# Patient Record
Sex: Female | Born: 1965 | Race: White | Hispanic: No | Marital: Married | Smoking: Never smoker
Health system: Southern US, Community
[De-identification: ages and names within clinical notes are randomized; demographics above are authoritative.]

## PROBLEM LIST (undated history)

## (undated) DIAGNOSIS — M6740 Ganglion, unspecified site: Secondary | ICD-10-CM

## (undated) DIAGNOSIS — Z973 Presence of spectacles and contact lenses: Secondary | ICD-10-CM

## (undated) DIAGNOSIS — I10 Essential (primary) hypertension: Secondary | ICD-10-CM

## (undated) DIAGNOSIS — M1990 Unspecified osteoarthritis, unspecified site: Secondary | ICD-10-CM

## (undated) DIAGNOSIS — G4733 Obstructive sleep apnea (adult) (pediatric): Secondary | ICD-10-CM

## (undated) DIAGNOSIS — K219 Gastro-esophageal reflux disease without esophagitis: Secondary | ICD-10-CM

## (undated) DIAGNOSIS — N183 Chronic kidney disease, stage 3 unspecified: Secondary | ICD-10-CM

## (undated) DIAGNOSIS — E041 Nontoxic single thyroid nodule: Secondary | ICD-10-CM

## (undated) DIAGNOSIS — R030 Elevated blood-pressure reading, without diagnosis of hypertension: Secondary | ICD-10-CM

## (undated) DIAGNOSIS — E782 Mixed hyperlipidemia: Secondary | ICD-10-CM

## (undated) DIAGNOSIS — R7303 Prediabetes: Secondary | ICD-10-CM

## (undated) DIAGNOSIS — N8500 Endometrial hyperplasia, unspecified: Secondary | ICD-10-CM

## (undated) DIAGNOSIS — N393 Stress incontinence (female) (male): Secondary | ICD-10-CM

## (undated) DIAGNOSIS — H04123 Dry eye syndrome of bilateral lacrimal glands: Secondary | ICD-10-CM

## (undated) HISTORY — PX: SHOULDER SURGERY: SHX246

## (undated) HISTORY — DX: Nontoxic single thyroid nodule: E04.1

## (undated) HISTORY — DX: Elevated blood-pressure reading, without diagnosis of hypertension: R03.0

## (undated) HISTORY — PX: THYROIDECTOMY: SHX17

## (undated) HISTORY — DX: Ganglion, unspecified site: M67.40

## (undated) HISTORY — DX: Obstructive sleep apnea (adult) (pediatric): G47.33

---
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
## 1998-04-01 ENCOUNTER — Other Ambulatory Visit: Admission: RE | Admit: 1998-04-01 | Discharge: 1998-04-01 | Payer: Self-pay | Admitting: Obstetrics and Gynecology

## 1998-04-27 ENCOUNTER — Other Ambulatory Visit: Admission: RE | Admit: 1998-04-27 | Discharge: 1998-04-27 | Payer: Self-pay | Admitting: *Deleted

## 1998-09-24 ENCOUNTER — Inpatient Hospital Stay (HOSPITAL_COMMUNITY): Admission: AD | Admit: 1998-09-24 | Discharge: 1998-09-27 | Payer: Self-pay | Admitting: Obstetrics and Gynecology

## 1998-10-23 ENCOUNTER — Other Ambulatory Visit: Admission: RE | Admit: 1998-10-23 | Discharge: 1998-10-23 | Payer: Self-pay | Admitting: Obstetrics and Gynecology

## 1999-10-28 ENCOUNTER — Other Ambulatory Visit: Admission: RE | Admit: 1999-10-28 | Discharge: 1999-10-28 | Payer: Self-pay | Admitting: Obstetrics and Gynecology

## 1999-11-16 ENCOUNTER — Encounter: Admission: RE | Admit: 1999-11-16 | Discharge: 2000-02-14 | Payer: Self-pay | Admitting: Obstetrics & Gynecology

## 2000-11-22 ENCOUNTER — Other Ambulatory Visit: Admission: RE | Admit: 2000-11-22 | Discharge: 2000-11-22 | Payer: Self-pay | Admitting: Obstetrics and Gynecology

## 2001-02-01 ENCOUNTER — Encounter (INDEPENDENT_AMBULATORY_CARE_PROVIDER_SITE_OTHER): Payer: Self-pay | Admitting: *Deleted

## 2001-02-01 ENCOUNTER — Ambulatory Visit (HOSPITAL_COMMUNITY): Admission: RE | Admit: 2001-02-01 | Discharge: 2001-02-01 | Payer: Self-pay | Admitting: *Deleted

## 2001-11-26 ENCOUNTER — Other Ambulatory Visit: Admission: RE | Admit: 2001-11-26 | Discharge: 2001-11-26 | Payer: Self-pay | Admitting: Obstetrics and Gynecology

## 2001-11-27 ENCOUNTER — Ambulatory Visit (HOSPITAL_COMMUNITY): Admission: RE | Admit: 2001-11-27 | Discharge: 2001-11-27 | Payer: Self-pay

## 2002-02-18 ENCOUNTER — Ambulatory Visit (HOSPITAL_COMMUNITY): Admission: RE | Admit: 2002-02-18 | Discharge: 2002-02-18 | Payer: Self-pay

## 2002-04-01 ENCOUNTER — Encounter: Admission: RE | Admit: 2002-04-01 | Discharge: 2002-04-01 | Payer: Self-pay

## 2002-05-01 ENCOUNTER — Ambulatory Visit (HOSPITAL_COMMUNITY): Admission: RE | Admit: 2002-05-01 | Discharge: 2002-05-01 | Payer: Self-pay

## 2002-06-14 ENCOUNTER — Ambulatory Visit (HOSPITAL_COMMUNITY): Admission: RE | Admit: 2002-06-14 | Discharge: 2002-06-14 | Payer: Self-pay

## 2002-06-18 ENCOUNTER — Inpatient Hospital Stay (HOSPITAL_COMMUNITY): Admission: AD | Admit: 2002-06-18 | Discharge: 2002-06-18 | Payer: Self-pay

## 2002-07-04 ENCOUNTER — Ambulatory Visit (HOSPITAL_COMMUNITY): Admission: RE | Admit: 2002-07-04 | Discharge: 2002-07-04 | Payer: Self-pay

## 2002-07-09 ENCOUNTER — Inpatient Hospital Stay (HOSPITAL_COMMUNITY): Admission: AD | Admit: 2002-07-09 | Discharge: 2002-07-12 | Payer: Self-pay

## 2002-07-09 ENCOUNTER — Encounter (INDEPENDENT_AMBULATORY_CARE_PROVIDER_SITE_OTHER): Payer: Self-pay

## 2002-12-09 ENCOUNTER — Other Ambulatory Visit: Admission: RE | Admit: 2002-12-09 | Discharge: 2002-12-09 | Payer: Self-pay

## 2002-12-20 ENCOUNTER — Ambulatory Visit (HOSPITAL_COMMUNITY): Admission: RE | Admit: 2002-12-20 | Discharge: 2002-12-20 | Payer: Self-pay | Admitting: Endocrinology

## 2002-12-20 ENCOUNTER — Encounter: Payer: Self-pay | Admitting: Endocrinology

## 2002-12-30 ENCOUNTER — Encounter (INDEPENDENT_AMBULATORY_CARE_PROVIDER_SITE_OTHER): Payer: Self-pay | Admitting: *Deleted

## 2002-12-30 ENCOUNTER — Ambulatory Visit (HOSPITAL_COMMUNITY): Admission: RE | Admit: 2002-12-30 | Discharge: 2002-12-30 | Payer: Self-pay | Admitting: Endocrinology

## 2002-12-30 ENCOUNTER — Encounter: Payer: Self-pay | Admitting: Endocrinology

## 2003-12-24 ENCOUNTER — Other Ambulatory Visit: Admission: RE | Admit: 2003-12-24 | Discharge: 2003-12-24 | Payer: Self-pay | Admitting: Obstetrics and Gynecology

## 2004-10-08 ENCOUNTER — Ambulatory Visit (HOSPITAL_COMMUNITY): Admission: RE | Admit: 2004-10-08 | Discharge: 2004-10-08 | Payer: Self-pay | Admitting: Endocrinology

## 2004-12-29 ENCOUNTER — Other Ambulatory Visit: Admission: RE | Admit: 2004-12-29 | Discharge: 2004-12-29 | Payer: Self-pay | Admitting: Obstetrics and Gynecology

## 2005-01-19 ENCOUNTER — Ambulatory Visit (HOSPITAL_COMMUNITY): Admission: RE | Admit: 2005-01-19 | Discharge: 2005-01-19 | Payer: Self-pay | Admitting: Endocrinology

## 2005-02-03 ENCOUNTER — Encounter (HOSPITAL_COMMUNITY): Admission: RE | Admit: 2005-02-03 | Discharge: 2005-02-03 | Payer: Self-pay | Admitting: Endocrinology

## 2005-06-10 DIAGNOSIS — E890 Postprocedural hypothyroidism: Secondary | ICD-10-CM

## 2005-06-10 HISTORY — DX: Postprocedural hypothyroidism: E89.0

## 2005-06-21 ENCOUNTER — Encounter (INDEPENDENT_AMBULATORY_CARE_PROVIDER_SITE_OTHER): Payer: Self-pay | Admitting: *Deleted

## 2005-06-21 ENCOUNTER — Ambulatory Visit (HOSPITAL_COMMUNITY): Admission: RE | Admit: 2005-06-21 | Discharge: 2005-06-22 | Payer: Self-pay | Admitting: Surgery

## 2005-06-21 DIAGNOSIS — Z8639 Personal history of other endocrine, nutritional and metabolic disease: Secondary | ICD-10-CM

## 2005-06-21 HISTORY — PX: TOTAL THYROIDECTOMY: SHX2547

## 2005-06-21 HISTORY — DX: Personal history of other endocrine, nutritional and metabolic disease: Z86.39

## 2006-10-18 IMAGING — CR DG CHEST 2V
2 series · 2 of 2 positions shown · non-contrast
Comparison: none

CLINICAL DATA: Goiter. 
 CHEST - 2 VIEW:

[view not recorded (1 of 2)]
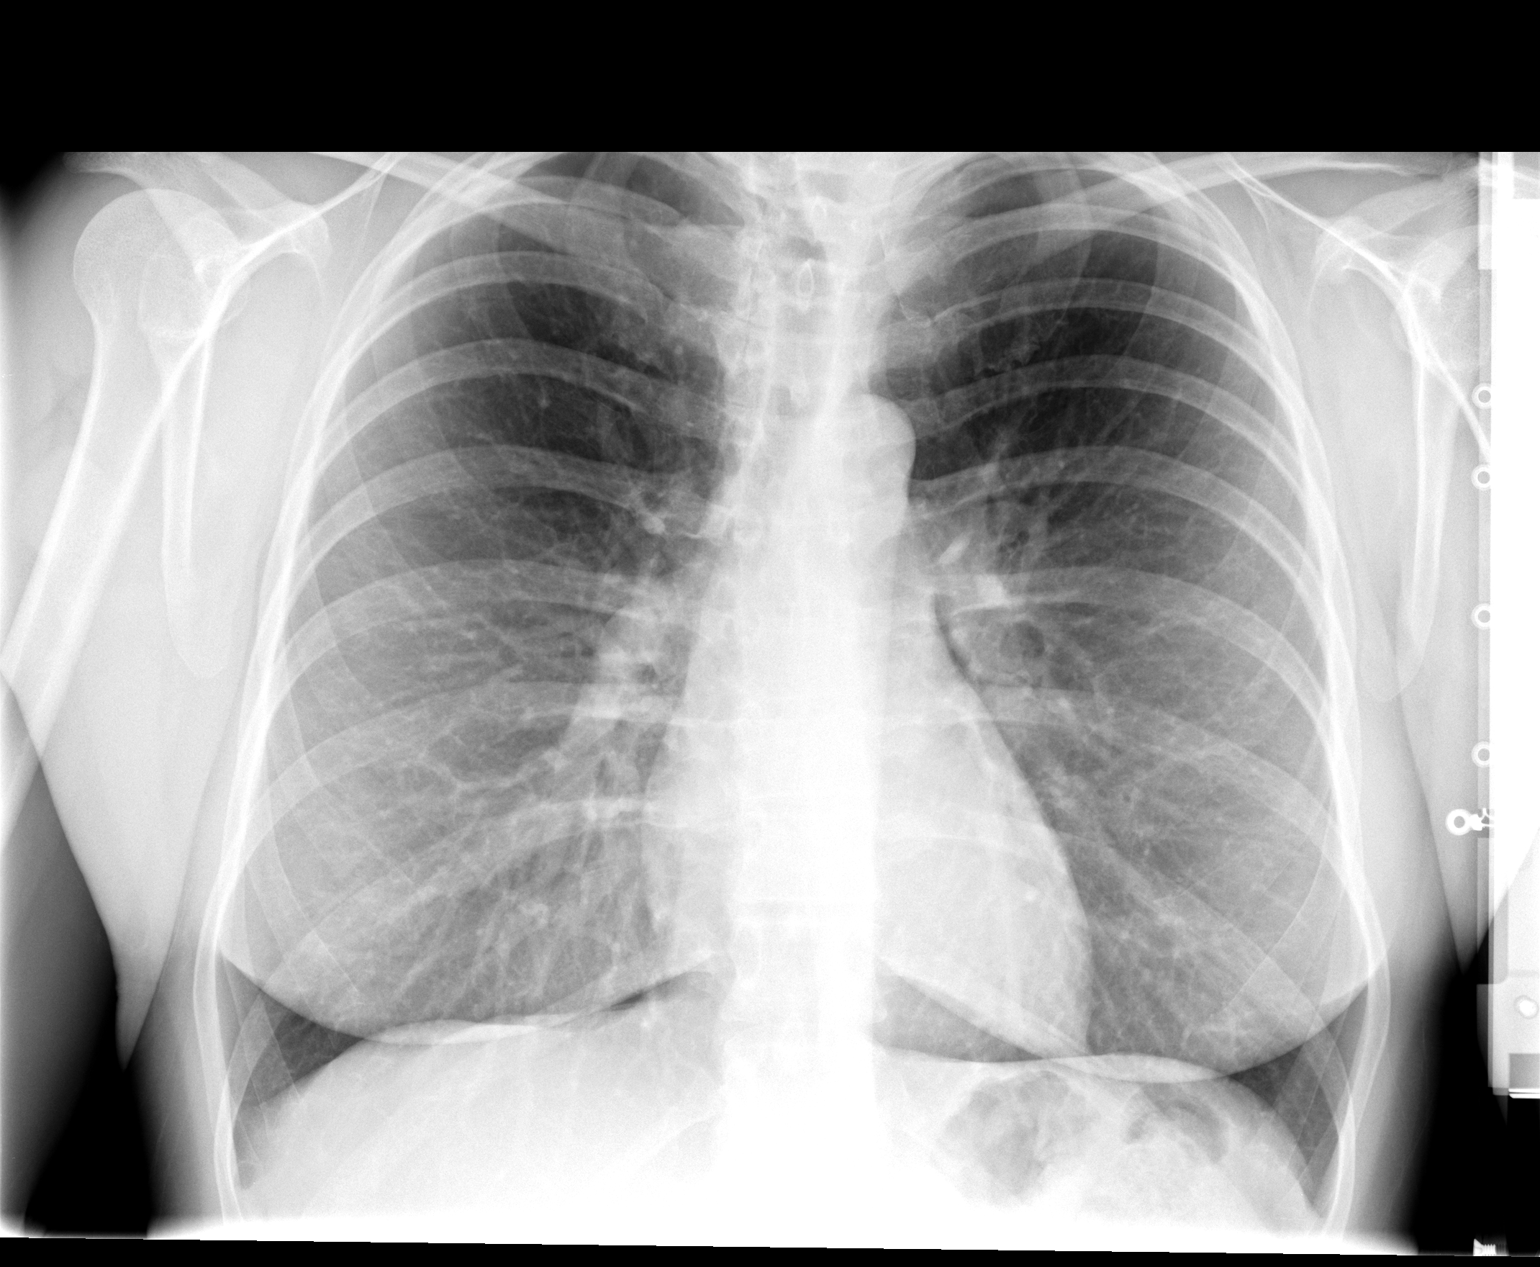

[view not recorded (2 of 2)]
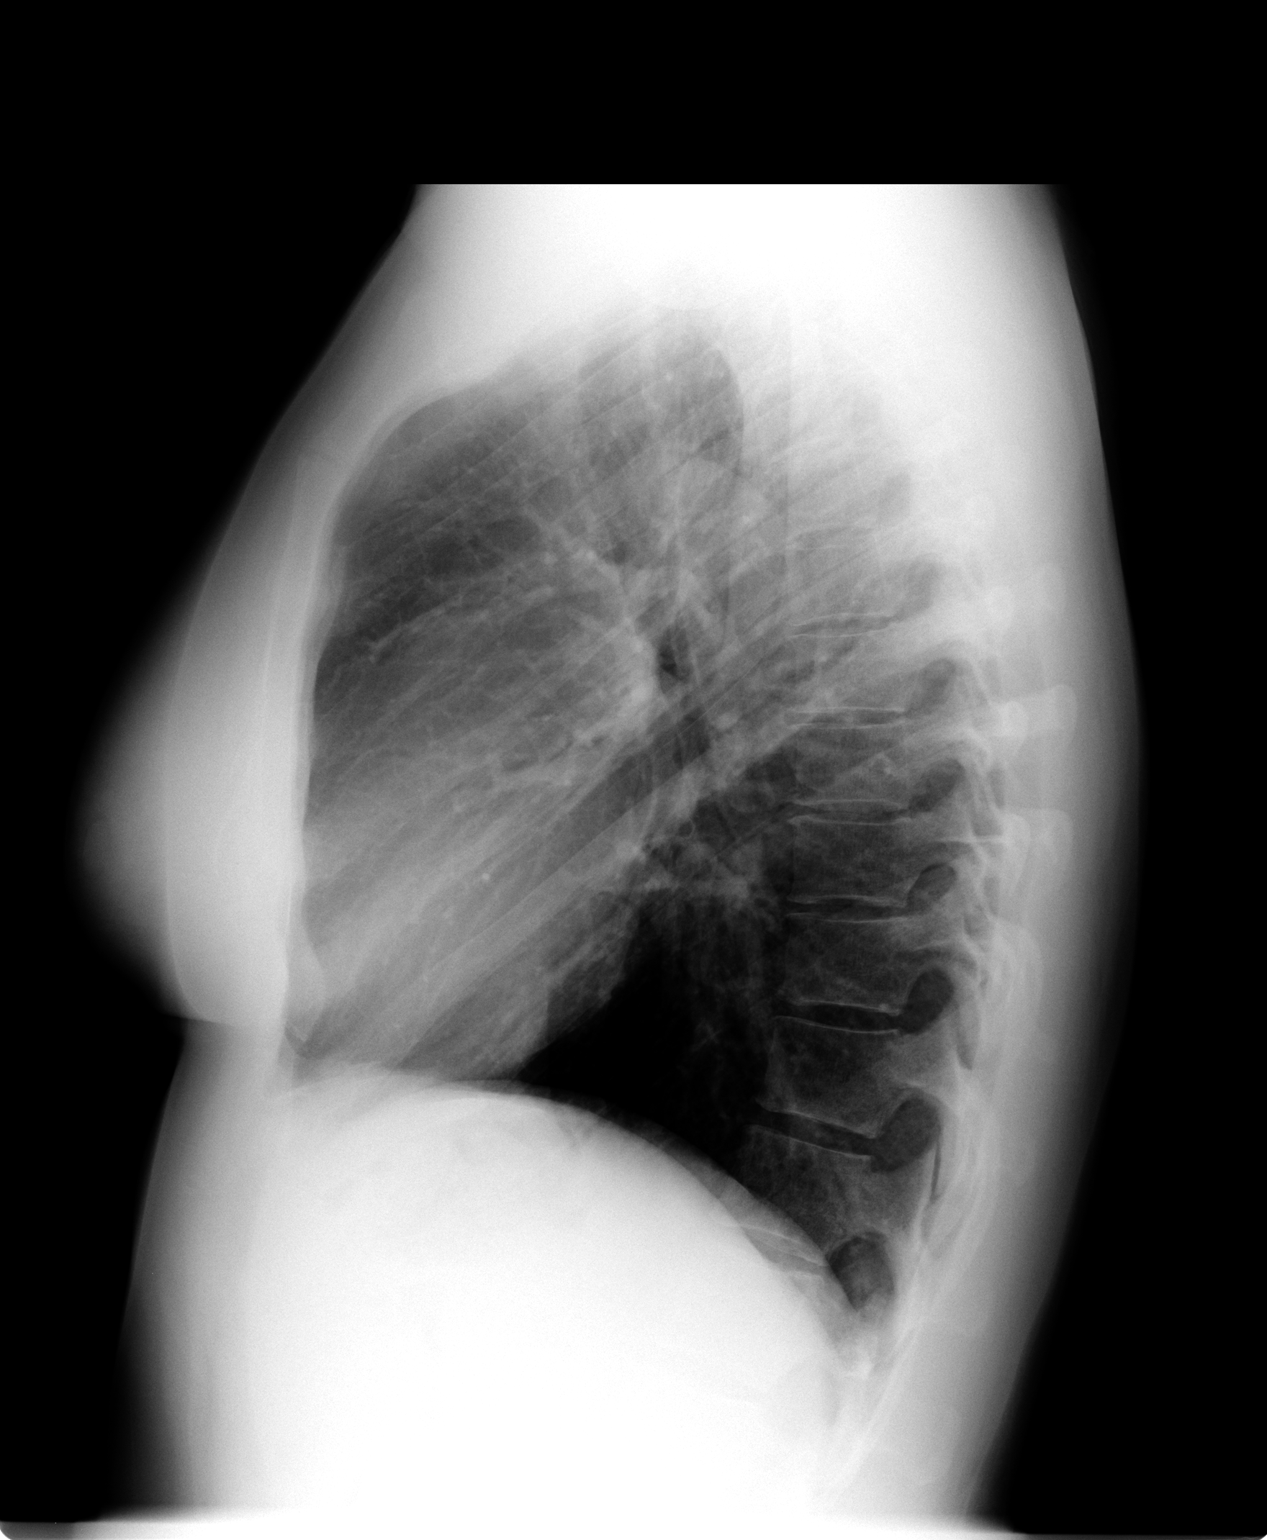

[2 of 2 positions shown; findings below may reference images not displayed]

FINDINGS: There is rightward deviation of the trachea, consistent with given history of thyroid enlargement.  Lungs are clear.  Heart size normal.
IMPRESSION: 1.  No acute cardiopulmonary process. 
 2.  Rightward deviation of the trachea, consistent with given history of goiter.

## 2007-06-08 ENCOUNTER — Other Ambulatory Visit: Admission: RE | Admit: 2007-06-08 | Discharge: 2007-06-08 | Payer: Self-pay | Admitting: Obstetrics and Gynecology

## 2007-09-27 ENCOUNTER — Encounter: Admission: RE | Admit: 2007-09-27 | Discharge: 2007-09-27 | Payer: Self-pay | Admitting: Obstetrics and Gynecology

## 2007-10-05 ENCOUNTER — Encounter: Admission: RE | Admit: 2007-10-05 | Discharge: 2007-10-05 | Payer: Self-pay | Admitting: Obstetrics and Gynecology

## 2008-05-05 ENCOUNTER — Encounter: Admission: RE | Admit: 2008-05-05 | Discharge: 2008-05-05 | Payer: Self-pay | Admitting: Obstetrics and Gynecology

## 2008-06-09 ENCOUNTER — Other Ambulatory Visit: Admission: RE | Admit: 2008-06-09 | Discharge: 2008-06-09 | Payer: Self-pay | Admitting: Obstetrics and Gynecology

## 2008-10-29 ENCOUNTER — Encounter: Admission: RE | Admit: 2008-10-29 | Discharge: 2008-10-29 | Payer: Self-pay | Admitting: Obstetrics and Gynecology

## 2009-05-18 ENCOUNTER — Encounter: Admission: RE | Admit: 2009-05-18 | Discharge: 2009-05-18 | Payer: Self-pay | Admitting: Obstetrics and Gynecology

## 2009-06-10 ENCOUNTER — Other Ambulatory Visit: Admission: RE | Admit: 2009-06-10 | Discharge: 2009-06-10 | Payer: Self-pay | Admitting: Obstetrics and Gynecology

## 2009-11-03 ENCOUNTER — Encounter: Admission: RE | Admit: 2009-11-03 | Discharge: 2009-11-03 | Payer: Self-pay | Admitting: Obstetrics and Gynecology

## 2010-06-17 ENCOUNTER — Other Ambulatory Visit: Admission: RE | Admit: 2010-06-17 | Discharge: 2010-06-17 | Payer: Self-pay | Admitting: Obstetrics and Gynecology

## 2010-10-31 ENCOUNTER — Encounter: Payer: Self-pay | Admitting: Endocrinology

## 2010-10-31 ENCOUNTER — Encounter: Payer: Self-pay | Admitting: Obstetrics and Gynecology

## 2011-01-12 ENCOUNTER — Other Ambulatory Visit: Payer: Self-pay | Admitting: Dermatology

## 2011-02-25 NOTE — Discharge Summary (Signed)
NAME:  Sydney Chapman, Sydney Chapman                           ACCOUNT NO.:  [id number]   MEDICAL RECORD NO.:  [id number]                   PATIENT TYPE:  INP   LOCATION:  9115                                 FACILITY:  [facility]   PHYSICIAN:  Ronda Fairly. Galen Daft, M.D.              DATE OF BIRTH:  02-07-1966   DATE OF ADMISSION:  07/09/2002  DATE OF DISCHARGE:  07/12/2002                                 DISCHARGE SUMMARY   ADMISSION DIAGNOSES:  Fetal macrosomia.   PRINCIPAL DIAGNOSES:  Fetal macrosomia.   SECONDARY DIAGNOSES:  1. Multiparity.  2. Advanced maternal age.  3. Desires elective sterilization.   PRINCIPAL PROCEDURE:  Primary low transverse cesarean section.   SECONDARY PROCEDURE:  Bilateral tubal ligation.   COMPLICATIONS:  None.   CONDITION ON DISCHARGE:  Stable.   HOSPITAL COURSE:  The patient was a G3, P2 with two prior spontaneous  vaginal deliveries who was admitted at 39 weeks because of term pregnancy  with an estimated fetal weight of 4618 g from an ultrasound a few days prior  to admission.  The patient was apprised of the risks of vaginal delivery  with the estimated fetal weight greater than 10 pounds and the option of  cesarean section.  She also wished to have a tubal ligation.  This was  therefore implemented on September 30.  She had an elective cesarean section  for fetal macrosomia and a bilateral tubal ligation.  She had a baby girl  weight of 8 pounds 11 ounces, Apgars of 9 at one minute and 9 at five  minutes.  On postoperative day one there were no complaints.  She was doing  quite well and the abdomen had some oozing on the day before this, but was  dry on the first postoperative day.  On the second postoperative day she was  doing very well and considering going home at that point.  However, she  wanted to stay a third day as she wanted some additional rest in the  hospital after cesarean section.  She had a bowel movement prior to  discharge, full activity, and  regular diet without any difficulty.  Her  postoperative hemoglobin was stable and the patient had a normal pathology  of the tubes bilaterally without any evidence of disease and there was  complete transection.  Her postoperative hemoglobin was 11.2.  The RPR was  nonreactive.  She was discharged home with Percocet and to follow up in the  office in two weeks.  Activity limits, wound care, and medications were  reviewed with the patient as well as diet.                                               Ronda Fairly. Galen Daft, M.D.  NJT/MEDQ  D:  08/12/2002  T:  08/12/2002  Job:  [id number]

## 2011-02-25 NOTE — Op Note (Signed)
NAME:  Sydney Chapman, Sydney Chapman                 ACCOUNT NO.:  [id number]   MEDICAL RECORD NO.:  [id number]          PATIENT TYPE:  AMB   LOCATION:  DAY                          FACILITY:  [facility]   PHYSICIAN:  Velora Heckler, MD      DATE OF BIRTH:  May 14, 1966   DATE OF PROCEDURE:  06/21/2005  DATE OF DISCHARGE:                                 OPERATIVE REPORT   PREOPERATIVE DIAGNOSIS:  Thyroid nodules, compressive symptoms.   POSTOPERATIVE DIAGNOSIS:  Thyroid nodules, compressive symptoms.   PROCEDURE:  Total thyroidectomy.   SURGEON:  Velora Heckler, M.D.>   ASSISTANT:  Adolph Pollack, M.D.   ANESTHESIA:  General.   ESTIMATED BLOOD LOSS:  Minimal.   PREPARATION:  Betadine.   COMPLICATIONS:  None.   INDICATIONS:  The patient is 45 year old white female from [place], [place]. The patient was referred by Dr. Dorisann Frames for  multinodular  goiter with dominant left-sided mass. This had been diagnosed in 1992. Gland  had gradually increased in size. Fine-needle aspiration cytology was benign  on two occasions. The patient however developed compressive symptoms.  Nuclear medicine scanning showed the dominant left-sided nodule to be cold  in nature. The patient was referred for thyroidectomy.   PROCEDURE IN DETAIL:  The procedure was done in OR #11 at the [facility]. The patient is brought to the operating room, placed in  supine position on the operating room table. Following administration of  general anesthesia the patient is prepped and draped in usual strict aseptic  fashion. After ascertaining that an adequate level of anesthesia been  obtained, a Kocher incision was made with a #10 blade. Dissection was  carried through subcutaneous tissues and platysma. Hemostasis was obtained  with electrocautery. Skin flaps were developed cephalad and caudad from the  thyroid notch to the sternal notch. A Mahorner self-retaining retractor was  placed for  exposure. Strap muscles were incised in the midline. Dissection  is begun on the left side of the neck. Strap muscles were reflected  laterally. Left thyroid lobe was dissected out. Venous tributaries coming in  inferiorly were ligated in continuity with 2-0 silk ties and divided. Gland  is rolled anteriorly. Superior pole was dissected out. Superior pole vessels  are ligated individually with medium Ligaclips and divided. Gland is rolled  further anteriorly. Parathyroid tissue is identified and preserved.  Recurrent laryngeal nerve is identified and preserved. Branches of the  inferior thyroid artery are divided between small Ligaclips.  Ligament of  Allyson Sabal was divided with electrocautery carefully. Gland is rolled anteriorly  up and onto the anterior trachea. Dry pack is placed in the left neck.   Next, we turned our attention to the right thyroid lobe. This was more  normal size. Left thyroid lobe measured approximately 8 cm at greatest  dimension. It was essentially entirely replaced by the dominant nodule.  Right thyroid lobe, however, appears grossly normal with some several  nodularity particularly in the superior pole. Strap muscles are again  reflected laterally.  Middle thyroid vein is divided between small  Ligaclips. Inferior venous tributaries are divided between small and medium  Ligaclips.  Superior pole vessels are dissected out, ligated in continuity  with 2-0 silk ties and medium Ligaclips and divided. Gland is rolled  anteriorly. Parathyroid tissue is identified and preserved. Recurrent nerve  is identified and preserved. Branches of the inferior thyroid artery are  divided between small Ligaclips. Ligament of Allyson Sabal is transected with  electrocautery and the gland is rolled up and onto the anterior trachea from  which it was excised completely with electrocautery. Sutures used to mark  the left superior pole. Entire thyroid gland is submitted to pathology for  review.  Neck is irrigated on both sides. Good hemostasis was noted. Surgicel  is placed over the area of the recurrent nerves bilaterally. Strap muscles  were reapproximated midline with interrupted 3-0 Vicryl sutures. Platysma  was closed with interrupted 3-0 Vicryl sutures. Skin is closed with running  4-0 Vicryl subcuticular suture. Wound is washed and dried. Benzoin, Steri-  Strips are applied. Sterile dressings were applied. The patient is awakened  from anesthesia and brought to the recovery room in stable condition. The  patient tolerated the procedure well.      Velora Heckler, MD  Electronically Signed     TMG/MEDQ  D:  06/21/2005  T:  06/21/2005  Job:  [id number]   cc:   Dorisann Frames, M.D.  [place], [place] [address]  Fax: [phone]   Dr. Suzzette Righter   Artist Pais, M.D.  [address], [address]  [place]  [place] [address]  Fax: [phone]

## 2011-02-25 NOTE — Op Note (Signed)
NAME:  Sydney Chapman, Sydney Chapman                           ACCOUNT NO.:  [id number]   MEDICAL RECORD NO.:  [id number]                   PATIENT TYPE:  INP   LOCATION:  9199                                 [facility]ILITY:  [facility]   PHYSICIAN:  Ronda Fairly. Galen Daft, M.D.              DATE OF BIRTH:  1966/08/31   DATE OF PROCEDURE:  07/09/2002  DATE OF DISCHARGE:                                 OPERATIVE REPORT   PREOPERATIVE DIAGNOSES:  1. Fetal macrosomia at term.  2. Multiparity.  3. Advanced maternal age.  4. Desires elective sterilization.   POSTOPERATIVE DIAGNOSES:  1. Fetal macrosomia at term.  2. Multiparity.  3. Advanced maternal age.  4. Desires elective sterilization.   PROCEDURE:  Elective primary cesarean section with bilateral tubal ligation.   SURGEON:  Ronda Fairly. Galen Daft, M.D.   ANESTHESIA:  Spinal.   COMPLICATIONS:  None.   ESTIMATED BLOOD LOSS:  800 cc.   FINDINGS:  Female infant.  Apgars 9 at one minute and 9 at five minutes.  Weight 8 pounds 11 ounces.   PROCEDURE NOTE:  The patient was identified as herself.  She had informed  consent for tubal ligation and cesarean section.  The estimated fetal weight  by ultrasound examination was greater then 4600 grams, greater than 10  pounds.  The patient had been apprised of this risk of vaginal birth versus  cesarean and she elected for cesarean delivery.  At the same time, she also  wished to have tubal ligation.  The informed consent process was  carried  out prior to bringing her into the operating room and the patient had  preoperative antibiotics, 2 grams ampicillin.  A Pfannenstiel incision was  utilized after Betadine prep, sterile technique, and Foley catheterization.  The Pfannenstiel incision entered the abdomen clear or containing  structures.  The fascia was transected transversely and the abdomen was  entered, clear of all vital structures.  The bladder flap was created over  the visceral peritoneum overlying the uterus.   A low transverse uterine  incision was carried out.  The baby was in vertex presentation, baby girl.  Apgars 9 at one minute and 9 at five minutes, a weight of 8 pounds 11  ounces.  The placenta delivered spontaneously through the incision and the  uterus was swept free of containing substances.  The uterus was exteriorized  and the incision was closed with a running locking suture of 0 Vicryl.  There was complete hemostasis noted at the site.  The left tube was  identified down to its fimbriated end.  It was grasped with Tanja Port in the  mid section and tied x2 using 0 plain catgut.  This mid segment was removed  and there was complete hemostasis.  The left tube was then again checked for  hemostasis and was complete.  The right tube was identified in a similar  fashion down the fimbriated  end, separate and distinct from all other vital  structures.  It was ligated in the mid segment after being grasped with a  Babcock with two sutures of 0 plain catgut.  Again, hemostasis was noted.  The ovaries were unremarkable.  The uterus itself was unremarkable.  The  uterus was placed back into the abdominal cavity after suction irrigation  was carried out and it was again inspected for hemostasis, which was  complete.  Both tubal segments looked intact as well.  The muscle was  reapproximated in the midline with interrupted suture of 1 Vicryl.  This was  followed by closure of the fascia with 1 Vicryl from either angle.  There  was complete hemostasis noted.  All sponge, instrument, and needle counts  were correct.  The patient had no evidence of bleeding and the skin was then  closed with 3-0 Monocryl subcuticular closure.  Again, the last sponge,  instrument, and needle count was correct.  The patient left the operating  room in stable condition.  Total estimated blood loss was 800 cc.  There  were no complications.                                                Ronda Fairly. Galen Daft, M.D.     NJT/MEDQ  D:  07/09/2002  T:  07/09/2002  Job:  [id number]

## 2011-06-22 ENCOUNTER — Other Ambulatory Visit (HOSPITAL_COMMUNITY)
Admission: RE | Admit: 2011-06-22 | Discharge: 2011-06-22 | Disposition: A | Discharge status: Home / Self Care | Payer: No Typology Code available for payment source | Source: Ambulatory Visit | Attending: Obstetrics and Gynecology | Admitting: Obstetrics and Gynecology

## 2011-06-22 ENCOUNTER — Other Ambulatory Visit: Payer: Self-pay | Admitting: Obstetrics and Gynecology

## 2011-06-22 DIAGNOSIS — Z1159 Encounter for screening for other viral diseases: Secondary | ICD-10-CM | POA: Insufficient documentation

## 2011-06-22 DIAGNOSIS — Z01419 Encounter for gynecological examination (general) (routine) without abnormal findings: Secondary | ICD-10-CM | POA: Insufficient documentation

## 2012-06-25 ENCOUNTER — Other Ambulatory Visit: Payer: Self-pay | Admitting: Obstetrics and Gynecology

## 2012-06-25 DIAGNOSIS — Z1231 Encounter for screening mammogram for malignant neoplasm of breast: Secondary | ICD-10-CM

## 2012-07-09 ENCOUNTER — Ambulatory Visit
Admission: RE | Admit: 2012-07-09 | Discharge: 2012-07-09 | Disposition: A | Discharge status: Home / Self Care | Payer: BC Managed Care – PPO | Source: Ambulatory Visit | Attending: Obstetrics and Gynecology | Admitting: Obstetrics and Gynecology

## 2012-07-09 DIAGNOSIS — Z1231 Encounter for screening mammogram for malignant neoplasm of breast: Secondary | ICD-10-CM

## 2012-07-25 ENCOUNTER — Ambulatory Visit: Payer: No Typology Code available for payment source

## 2012-12-13 ENCOUNTER — Other Ambulatory Visit: Payer: Self-pay | Admitting: Dermatology

## 2013-09-25 ENCOUNTER — Other Ambulatory Visit: Payer: Self-pay | Admitting: Sports Medicine

## 2013-09-25 DIAGNOSIS — M25511 Pain in right shoulder: Secondary | ICD-10-CM

## 2013-10-10 HISTORY — PX: SHOULDER ARTHROSCOPY W/ LABRAL REPAIR: SHX2399

## 2014-05-28 ENCOUNTER — Other Ambulatory Visit: Payer: Self-pay

## 2014-05-28 DIAGNOSIS — Z1231 Encounter for screening mammogram for malignant neoplasm of breast: Secondary | ICD-10-CM

## 2014-06-05 ENCOUNTER — Ambulatory Visit
Admission: RE | Admit: 2014-06-05 | Discharge: 2014-06-05 | Disposition: A | Discharge status: Home / Self Care | Payer: 59 | Source: Ambulatory Visit

## 2014-06-05 ENCOUNTER — Encounter (INDEPENDENT_AMBULATORY_CARE_PROVIDER_SITE_OTHER): Payer: Self-pay

## 2014-06-05 DIAGNOSIS — Z1231 Encounter for screening mammogram for malignant neoplasm of breast: Secondary | ICD-10-CM

## 2014-07-16 ENCOUNTER — Other Ambulatory Visit: Payer: Self-pay | Admitting: Obstetrics and Gynecology

## 2014-07-16 ENCOUNTER — Other Ambulatory Visit (HOSPITAL_COMMUNITY)
Admission: RE | Admit: 2014-07-16 | Discharge: 2014-07-16 | Disposition: A | Discharge status: Home / Self Care | Payer: 59 | Source: Ambulatory Visit | Attending: Obstetrics and Gynecology | Admitting: Obstetrics and Gynecology

## 2014-07-16 DIAGNOSIS — Z1151 Encounter for screening for human papillomavirus (HPV): Secondary | ICD-10-CM | POA: Insufficient documentation

## 2014-07-16 DIAGNOSIS — Z01419 Encounter for gynecological examination (general) (routine) without abnormal findings: Secondary | ICD-10-CM | POA: Diagnosis present

## 2014-07-18 LAB — CYTOLOGY - PAP

## 2016-02-08 HISTORY — PX: COLONOSCOPY: SHX174

## 2016-02-17 ENCOUNTER — Other Ambulatory Visit: Payer: Self-pay | Admitting: Obstetrics and Gynecology

## 2016-02-17 DIAGNOSIS — Z1231 Encounter for screening mammogram for malignant neoplasm of breast: Secondary | ICD-10-CM

## 2016-02-25 ENCOUNTER — Ambulatory Visit: Payer: 59

## 2016-03-03 ENCOUNTER — Other Ambulatory Visit: Payer: Self-pay | Admitting: Gastroenterology

## 2016-07-07 ENCOUNTER — Ambulatory Visit
Admission: RE | Admit: 2016-07-07 | Discharge: 2016-07-07 | Disposition: A | Discharge status: Home / Self Care | Payer: 59 | Source: Ambulatory Visit | Attending: Obstetrics and Gynecology | Admitting: Obstetrics and Gynecology

## 2016-07-07 DIAGNOSIS — Z1231 Encounter for screening mammogram for malignant neoplasm of breast: Secondary | ICD-10-CM

## 2017-07-28 ENCOUNTER — Other Ambulatory Visit (HOSPITAL_COMMUNITY)
Admission: RE | Admit: 2017-07-28 | Discharge: 2017-07-28 | Disposition: A | Discharge status: Home / Self Care | Payer: 59 | Source: Ambulatory Visit | Attending: Obstetrics and Gynecology | Admitting: Obstetrics and Gynecology

## 2017-07-28 ENCOUNTER — Other Ambulatory Visit: Payer: Self-pay | Admitting: Obstetrics and Gynecology

## 2017-07-28 DIAGNOSIS — Z124 Encounter for screening for malignant neoplasm of cervix: Secondary | ICD-10-CM | POA: Insufficient documentation

## 2017-08-02 LAB — CYTOLOGY - PAP
Diagnosis: NEGATIVE
HPV (WINDOPATH): NOT DETECTED

## 2017-10-10 HISTORY — PX: KNEE ARTHROSCOPY W/ MENISCAL REPAIR: SHX1877

## 2018-10-30 ENCOUNTER — Encounter: Payer: Self-pay | Admitting: *Deleted

## 2018-11-15 ENCOUNTER — Telehealth: Payer: Self-pay | Admitting: *Deleted

## 2018-11-15 ENCOUNTER — Encounter

## 2018-11-15 ENCOUNTER — Encounter: Payer: Self-pay | Admitting: Cardiology

## 2018-11-15 ENCOUNTER — Ambulatory Visit: Payer: 59 | Admitting: Cardiology

## 2018-11-15 VITALS — BP 156/94 | HR 85 | Ht 68.0 in | Wt 276.2 lb

## 2018-11-15 DIAGNOSIS — E669 Obesity, unspecified: Secondary | ICD-10-CM | POA: Insufficient documentation

## 2018-11-15 DIAGNOSIS — G4733 Obstructive sleep apnea (adult) (pediatric): Secondary | ICD-10-CM | POA: Diagnosis not present

## 2018-11-15 DIAGNOSIS — R030 Elevated blood-pressure reading, without diagnosis of hypertension: Secondary | ICD-10-CM | POA: Insufficient documentation

## 2018-11-15 HISTORY — DX: Obstructive sleep apnea (adult) (pediatric): G47.33

## 2018-11-15 HISTORY — DX: Elevated blood-pressure reading, without diagnosis of hypertension: R03.0

## 2018-11-15 NOTE — Patient Instructions (Signed)
Medication Instructions:  Your physician recommends that you continue on your current medications as directed. Please refer to the Current Medication list given to you today.  If you need a refill on your cardiac medications before your next appointment, please call your pharmacy.   Lab work: None If you have labs (blood work) drawn today and your tests are completely normal, you will receive your results only by: Marland Kitchen MyChart Message (if you have MyChart) OR . A paper copy in the mail If you have any lab test that is abnormal or we need to change your treatment, we will call you to review the results.  Testing/Procedures: Your physician has recommended that you have a sleep study. This test records several body functions during sleep, including: brain activity, eye movement, oxygen and carbon dioxide blood levels, heart rate and rhythm, breathing rate and rhythm, the flow of air through your mouth and nose, snoring, body muscle movements, and chest and belly movement.  Follow-Up: As needed depending on results.

## 2018-11-15 NOTE — Telephone Encounter (Signed)
-----   Message from Ben G Kordsmeier, RN sent at 11/15/2018  3:31 PM EST ----- Regarding: Sleep Study Hello, Dr. Turner ordered a sleep study for the patient, her ESS: 16. Please schedule. Thanks, Ben  

## 2018-11-15 NOTE — Addendum Note (Signed)
Addended by: Dustin Flock on: 11/15/2018 02:22 PM   Modules accepted: Orders

## 2018-11-15 NOTE — Progress Notes (Signed)
Cardiology Office Note    Date:  11/15/2018   ID:  Sydney Chapman, DOB Nov 10, 1965, MRN [id number]  PCP:  Blair Heys, MD  Cardiologist:  Armanda Magic, MD   Chief Complaint  Patient presents with  . Sleep Apnea    History of Present Illness:  Sydney Chapman is a 53 y.o. female who is being seen today for the evaluation of obstructive sleep apnea at the request of Blair Heys, MD.  Is a very pleasant 53 year old female I had seen 7 years ago for obstructive sleep apnea.  She has a history of thyroid cyst with status post thyroidectomy with hypothyroidism, mild obstructive sleep apnea diagnosed by sleep study in 2011 with an AHI in rem sleep only of with AHI 9/h.  She had a home sleep study done in 2013 which showed no obstructive sleep apnea with an AHI of 2/h and no oxygen desaturations and stopped her CPAP therapy.   Unfortunately over the past several years she has gained over 100 pounds and is now started having problems with excessive daytime sleepiness and fatigue as well as significant snoring.  She tried going back on her CPAP but she says her device is not working and so she is now here for follow-up to try to get restarted with a new CPAP device.   Past Medical History:  Diagnosis Date  . Elevated BP without diagnosis of hypertension 11/15/2018  . Ganglion cyst   . OSA (obstructive sleep apnea) 11/15/2018   mild obstructive sleep apnea diagnosed by sleep study in 2011 with an AHI in REM sleep with AHI 9/h.  HST done in 2013 showed no obstructive sleep apnea with an AHI of 2/h and no oxygen desaturations and stopped her CPAP therapy.    . Thyroid cyst    Hypothyroid (s/p  thyroidectomy)  . Thyroid nodule     Past Surgical History:  Procedure Laterality Date  . CESAREAN SECTION    . SHOULDER SURGERY     to repair torn labrum in right shoulder1/2015  . THYROIDECTOMY     due to MNG 2006    Current Medications: Current Meds  Medication Sig  . levothyroxine  (SYNTHROID, LEVOTHROID) 200 MCG tablet Take 200 mcg by mouth daily before breakfast.  . Multiple Vitamin (MULTIVITAMIN) tablet Take 1 tablet by mouth daily.  . vitamin C (ASCORBIC ACID) 500 MG tablet Take 500 mg by mouth daily.  Marland Kitchen VITAMIN D PO Take 1 tablet by mouth daily.    Allergies:   Patient has no known allergies.   Social History   Socioeconomic History  . Marital status: Married    Spouse name: Not on file  . Number of children: Not on file  . Years of education: Not on file  . Highest education level: Not on file  Occupational History  . Not on file  Social Needs  . Financial resource strain: Not on file  . Food insecurity:    Worry: Not on file    Inability: Not on file  . Transportation needs:    Medical: Not on file    Non-medical: Not on file  Tobacco Use  . Smoking status: Never Smoker  . Smokeless tobacco: Never Used  Substance and Sexual Activity  . Alcohol use: No  . Drug use: No  . Sexual activity: Not on file  Lifestyle  . Physical activity:    Days per week: Not on file    Minutes per session: Not on file  .  Stress: Not on file  Relationships  . Social connections:    Talks on phone: Not on file    Gets together: Not on file    Attends religious service: Not on file    Active member of club or organization: Not on file    Attends meetings of clubs or organizations: Not on file    Relationship status: Not on file  Other Topics Concern  . Not on file  Social History Narrative   Tobacco use cigarettes: Never  Smoked , Tobacco history last updated 09/09/2013   No smoking ,no alcohol, no recreational , drug use. Occupation : self employed    Marital Status : married . Children: girls ,3 Seat belt use : yes                      Family History:  The patient's family history is not on file.   ROS:   Please see the history of present illness.    ROS All other systems reviewed and are negative.  No flowsheet data found.     PHYSICAL  EXAM:   VS:  BP (!) 156/94   Pulse 85   Ht 5\' 8"  (1.727 m)   Wt 276 lb 3.2 oz (125.3 kg)   SpO2 98%   BMI 42.00 kg/m    GEN: Well nourished, well developed, in no acute distress  HEENT: normal  Neck: no JVD, carotid bruits, or masses Cardiac: RRR; no murmurs, rubs, or gallops,no edema.  Intact distal pulses bilaterally.  Respiratory:  clear to auscultation bilaterally, normal work of breathing GI: soft, nontender, nondistended, + BS MS: no deformity or atrophy  Skin: warm and dry, no rash Neuro:  Alert and Oriented x 3, Strength and sensation are intact Psych: euthymic mood, full affect  Wt Readings from Last 3 Encounters:  11/15/18 276 lb 3.2 oz (125.3 kg)      Studies/Labs Reviewed:   EKG:  EKG is not ordered today.   Recent Labs: No results found for requested labs within last 8760 hours.   Lipid Panel No results found for: CHOL, TRIG, HDL, CHOLHDL, VLDL, LDLCALC, LDLDIRECT  Additional studies/ records that were reviewed today include:  Prior sleep studies from 2011 and home sleep study from 2013.  Office notes from PCP.    ASSESSMENT:    1. Obesity (BMI 30-39.9)   2. OSA (obstructive sleep apnea)   3. Elevated BP without diagnosis of hypertension      PLAN:  In order of problems listed above:  1. OSA -the patient has had very minimal sleep apnea in the past mainly during REM sleep at 9/h.  She had been on CPAP for a while but then repeat sleep study back in 2013 showed no significant sleep apnea she stopped using her device.  Over the past year she is gained significant amount of weight and since I saw her last over 100 pounds.  She is now having much more problems with excessive daytime fatigue as well as severe snoring.  She tried starting on her CPAP again but her unit is not working because it so old itwould not even give us a Nurse, adultdownload.  She would like a new device.  Since it is been so long since she is had a sleep study and her last home sleep study she did  not show any sleep apnea I recommended proceeding with a split-night sleep study to document obstructive sleep apnea and then get her back  on CPAP.  2.  Obesity -part of her weight gain has been because she has had some problems with orthopedic issues in her shoulders and knees.  I have encouraged her to try to watch her diet as well as exercise when she can  3.  Elevated blood pressure with no history of hypertension.  He says she thinks it is elevated because she was rushing to get here in the bad weather and then had to go back to her car to get something.  I have recommended she keep an eye on her blood pressure and follow-up with her PCP.    Medication Adjustments/Labs and Tests Ordered: Current medicines are reviewed at length with the patient today.  Concerns regarding medicines are outlined above.  Medication changes, Labs and Tests ordered today are listed in the Patient Instructions below.  There are no Patient Instructions on file for this visit.   Signed, Armanda Magicraci , MD  11/15/2018 2:15 PM    [facility] [address], [place], [place]  [address] Phone: [phone]) [phone]; Fax: [phone]

## 2018-11-16 ENCOUNTER — Telehealth: Payer: Self-pay | Admitting: *Deleted

## 2018-11-16 NOTE — Telephone Encounter (Signed)
-----   Message from Dustin Flock, RN sent at 11/15/2018  3:31 PM EST ----- Regarding: Sleep Study Hello, Dr. Mayford Knife ordered a sleep study for the patient, her ESS: 16. Please schedule. Thanks, [employer]

## 2018-11-16 NOTE — Telephone Encounter (Signed)
Staff message sent to Coralee North per Rudi Rummage @ [employer] on 11/16/18 no PA is required for outpatient sleep study.

## 2018-11-22 NOTE — Telephone Encounter (Signed)
Patient is scheduled for lab study on 01/15/19. Patient understands her sleep study will be done at [facility] sleep lab. Patient understands she will receive a sleep packet in a week or so. Patient understands to call if she does not receive the sleep packet in a timely manner. Patient agrees with treatment and thanked me for call.

## 2018-11-22 NOTE — Telephone Encounter (Signed)
RE: Sleep Burnard Bunting, CMA  Reesa Chew, CMA        Per Rudi Rummage with [employer] no PA is required. Ok to schedule.     ----- Message -----  From: Dustin Flock, RN  Sent: 11/15/2018  3:31 PM EST  To: Reesa Chew, CMA, [facility] Sleep Studies  Subject: Sleep Study                    Hello,  Dr. Mayford Knife ordered a sleep study for the patient, her ESS: 16. Please schedule.  Thanks,  [employer]

## 2019-01-15 ENCOUNTER — Encounter (HOSPITAL_BASED_OUTPATIENT_CLINIC_OR_DEPARTMENT_OTHER): Payer: 59

## 2019-01-28 ENCOUNTER — Encounter (HOSPITAL_BASED_OUTPATIENT_CLINIC_OR_DEPARTMENT_OTHER): Payer: 59

## 2019-01-28 ENCOUNTER — Encounter

## 2019-02-19 ENCOUNTER — Telehealth: Payer: Self-pay | Admitting: Cardiology

## 2019-02-19 NOTE — Telephone Encounter (Signed)
° °  1) What problem are you experiencing? Patient states her cpap machine is working better since last visit. Wants to know if she still needs the sleep study. Also has questions about getting SD card  2) Who is your medical equipment company?    Please route to the sleep study assistant.

## 2019-02-21 NOTE — Telephone Encounter (Signed)
Sleep study cancelled 02/21/19 per patient request.

## 2019-02-21 NOTE — Telephone Encounter (Signed)
Returned call: Patient wants to cancel her sleep study because her cpap machine is working now. Patient states she only needed the sleep study because her cpap was broken and to get a new one she would have to have a new sleep study. Patient also states at her last office visit her SD card was not returned to her and she would like to have it back. I will look in the office for it but encouraged her to ask [employer] if they can give her one or advise her where she can purchase one. Patient states she gets her supplies off line and does not have a local DME.

## 2019-03-12 ENCOUNTER — Encounter (HOSPITAL_BASED_OUTPATIENT_CLINIC_OR_DEPARTMENT_OTHER): Payer: 59

## 2020-09-11 ENCOUNTER — Other Ambulatory Visit: Payer: Self-pay | Admitting: Nephrology

## 2020-09-11 DIAGNOSIS — R7989 Other specified abnormal findings of blood chemistry: Secondary | ICD-10-CM

## 2020-10-01 ENCOUNTER — Ambulatory Visit
Admission: RE | Admit: 2020-10-01 | Discharge: 2020-10-01 | Disposition: A | Discharge status: Home / Self Care | Payer: 59 | Source: Ambulatory Visit | Attending: Nephrology | Admitting: Nephrology

## 2020-10-01 DIAGNOSIS — R7989 Other specified abnormal findings of blood chemistry: Secondary | ICD-10-CM

## 2022-02-03 IMAGING — US US RENAL
1 series · 14 of 25 positions shown · non-contrast
Comparison: None.

CLINICAL DATA: Elevated serum creatinine

EXAM:
RENAL / URINARY TRACT ULTRASOUND COMPLETE

[Series 1: us renal · 0.26mm/px · 14 of 38 slices shown]
[im 1/38]
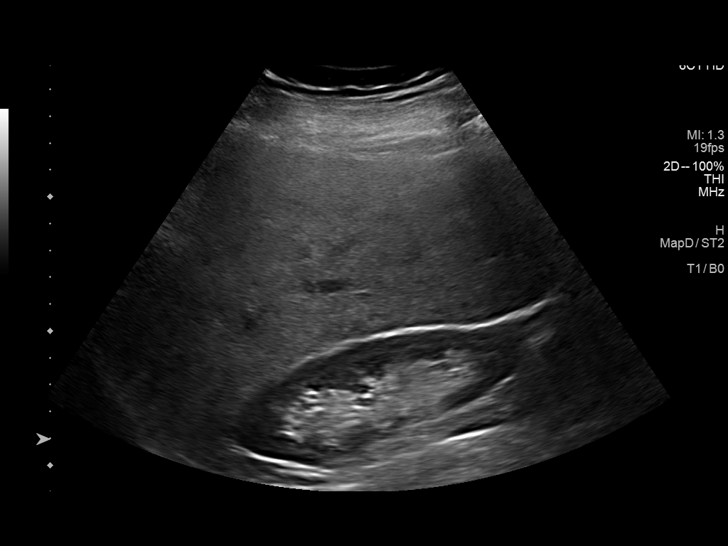
[im 4/38]
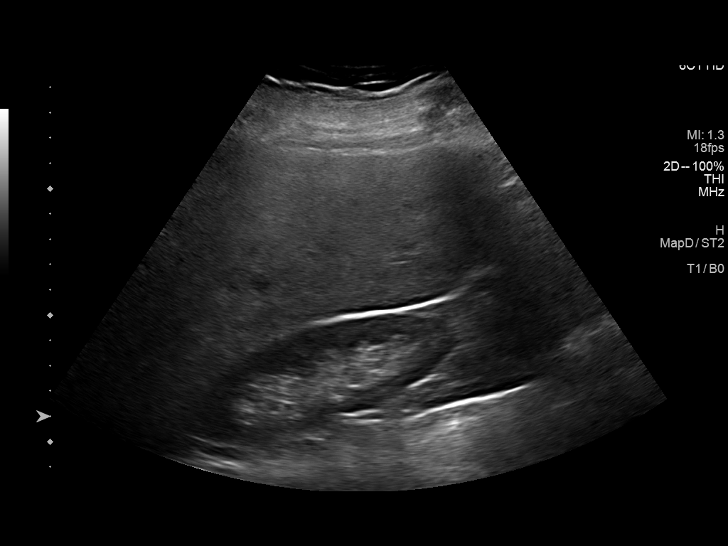
[im 7/38]
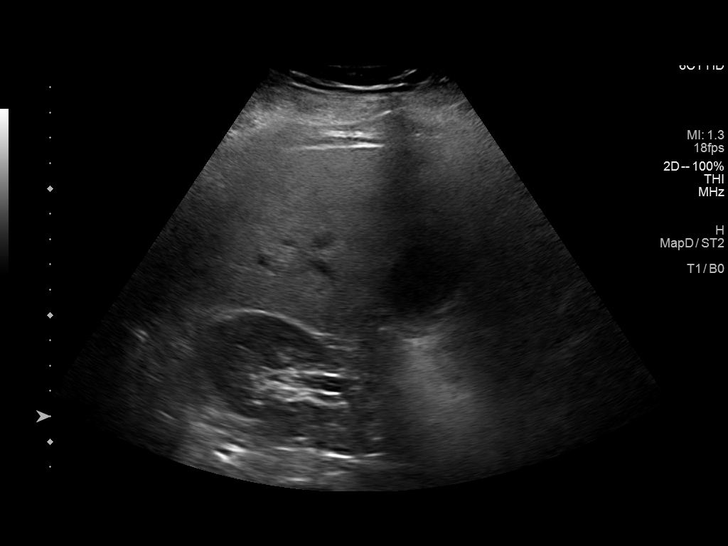
[im 10/38]
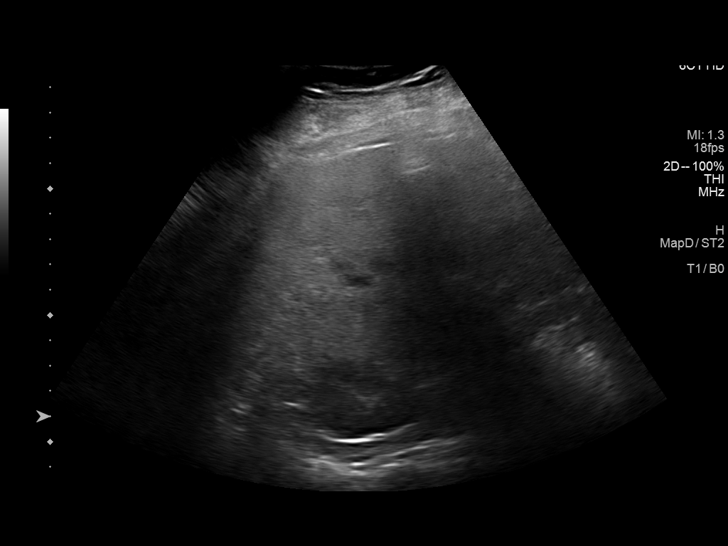
[im 13/38]
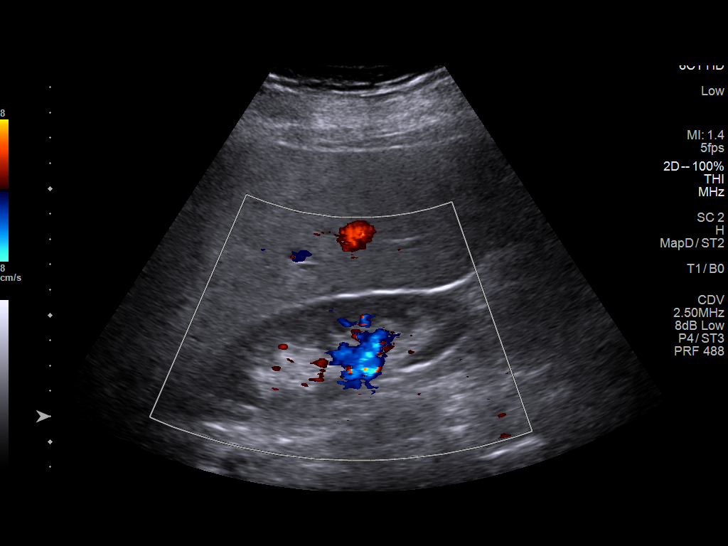
[im 14/38]
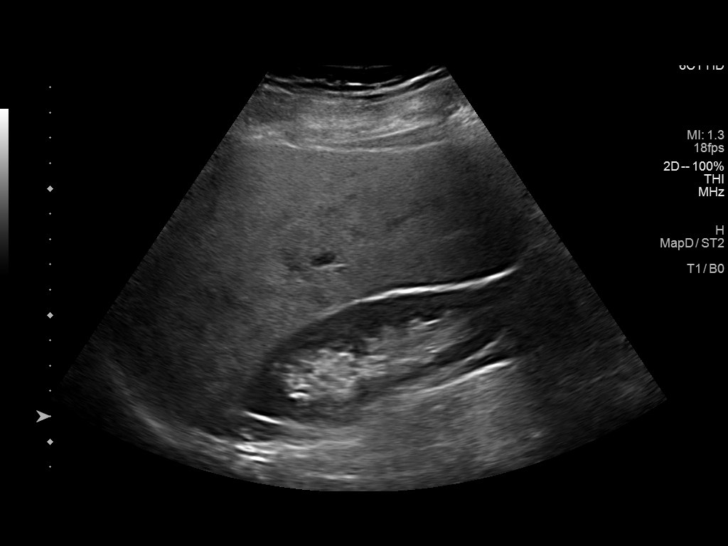
[im 17/38]
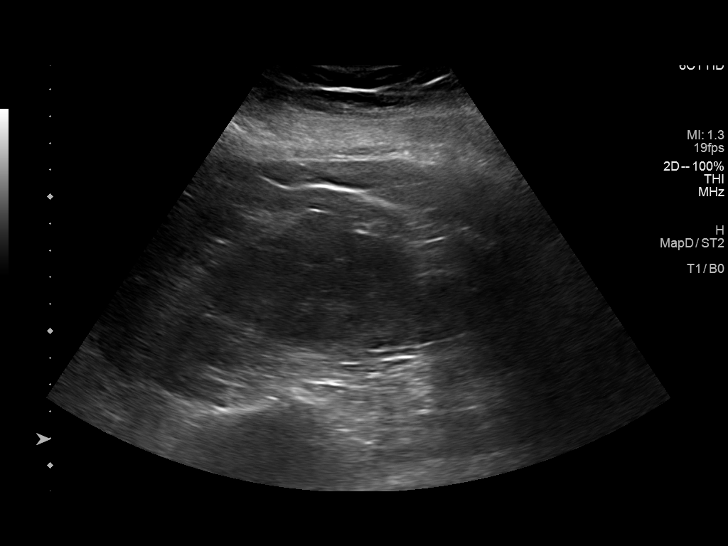
[im 21/38]
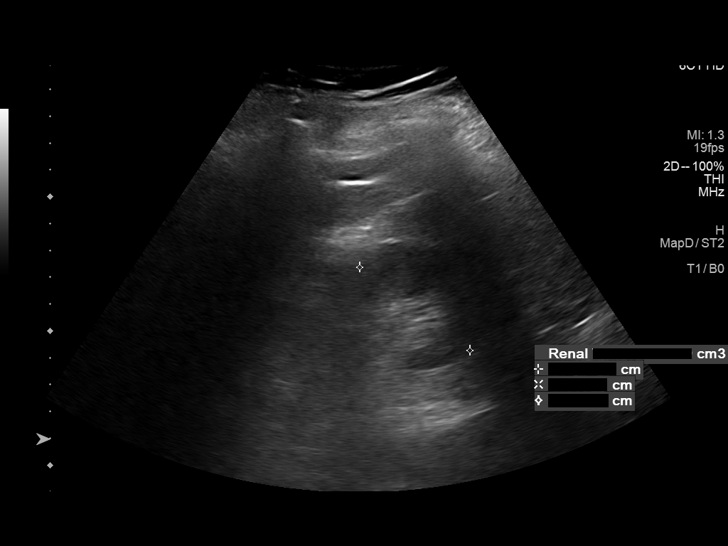
[im 24/38]
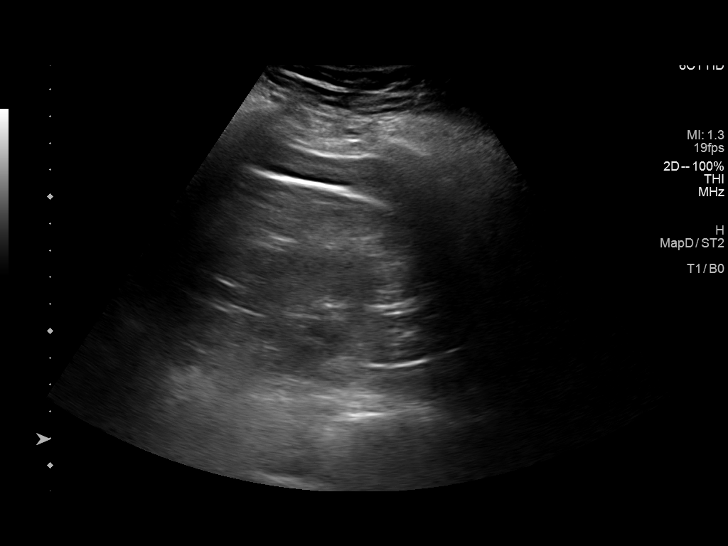
[im 25/38]
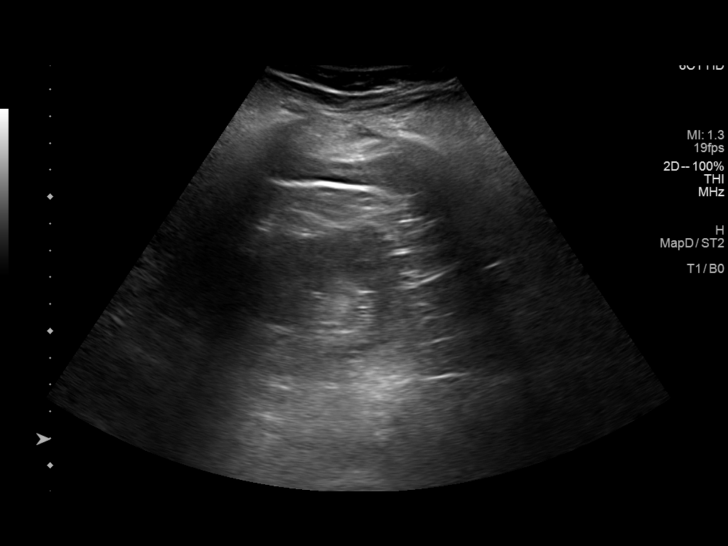
[im 28/38]
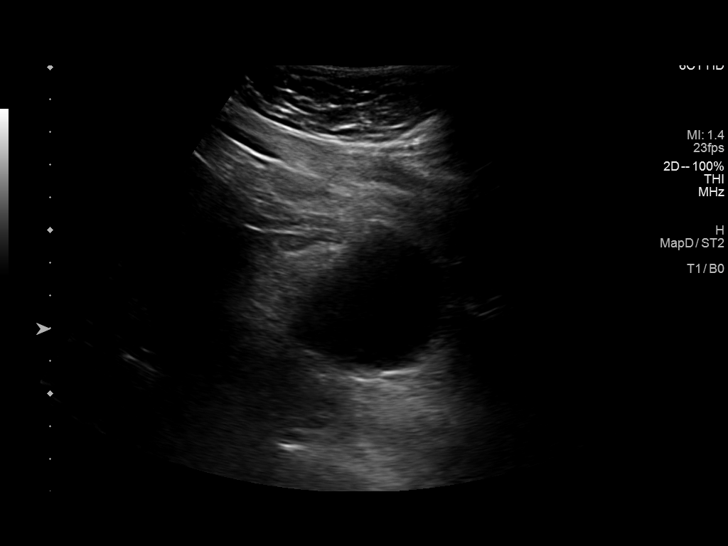
[im 31/38]
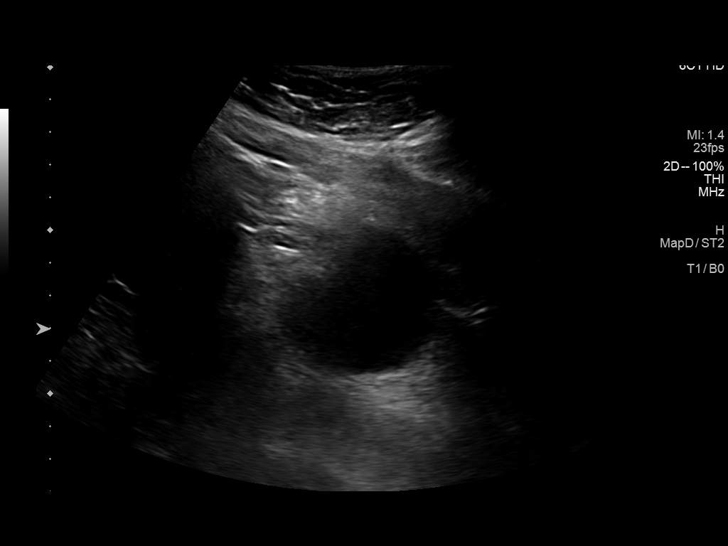
[im 34/38]
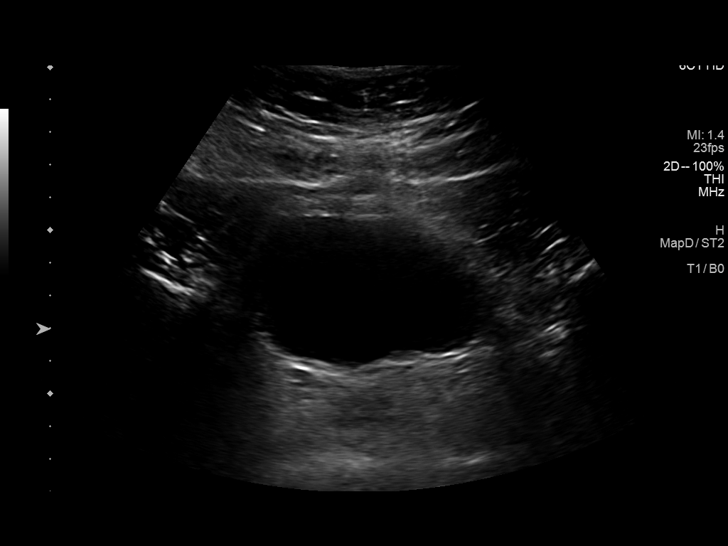
[im 38/38]
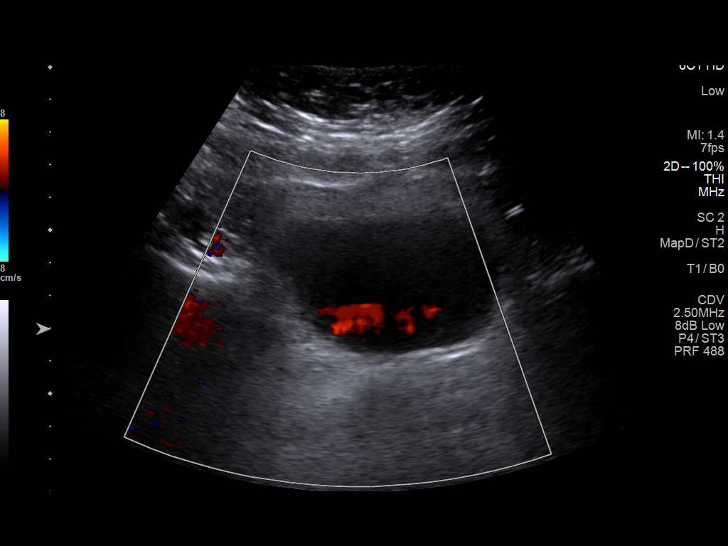

[14 of 25 positions shown; findings below may reference images not displayed]

FINDINGS: Right Kidney:

Renal measurements: 11.1 x 3.6 x 4.3 cm = volume: 88.3 mL.
Echogenicity within normal limits. No mass or hydronephrosis
visualized.

Left Kidney:

Renal measurements: 10.9 x 5.7 x 5.2 cm = volume: 167.9 mL.
Echogenicity within normal limits. No mass or hydronephrosis
visualized.

Bladder:

Appears normal for degree of bladder distention.

Other:

None.
IMPRESSION: Unremarkable renal ultrasound.

## 2023-12-11 NOTE — H&P (Signed)
 Sydney Chapman is an 58 y.o. G3P3 who is admitted for Robotic Assisted Total Laparoscopic Hysterectomy with bilateral salpingo-oophorectomy fo endometrial hyperplasia.  Patient has never gone one full year of amenorrhea. She went 11 months without menses in 2023. She underwent another work-up given her age and thickened endometrium on [place]. Repeat EMB in 2024 revealed foci worrisome for simple hyperplasia without atypia. Patient was offered conservative management with surveillance and medications; however, patient opted for definitive hysterectomy including bilateral oophorectomy.  Abdominal surgical history is significant for C/S x 1 and tubal ligation.  Work-up: 09/11/2023 EMB: Proliferative endometrium, small foci worrisome for simple hyperplasia without atypia  07/06/2020 EMB: Blood and scant strips of inactive endometrium. No hyperplasia/carcinoma.  07/03/2023 Labs: FSH 36.3 (high)  Estradiol 38.0  07/03/2023 Pap smear: NILM/HRHPV negative  08/09/2023 TVUS Uterus: 8.99 x 5.99 x 4.79 cm Endometrial thickness: 1.63 cm Left Ovary 2.67 cm Comments:Anteverted uterus. No uterine anomalies seen.Endometrium thickened, no blood flow noted.Nabothian cysts on cervix- largest 1.2 x 1.2 cm.Left ovary WNL. Right ovary not visualized. No adnexal masses seen.  Patient Active Problem List   Diagnosis Date Noted   OSA (obstructive sleep apnea) 11/15/2018   Obesity (BMI 30-39.9) 11/15/2018   Elevated BP without diagnosis of hypertension 11/15/2018    MEDICAL/FAMILY/SOCIAL HX: No LMP recorded.    Past Medical History:  Diagnosis Date   Elevated BP without diagnosis of hypertension 11/15/2018   OSA (obstructive sleep apnea) 11/15/2018   mild obstructive sleep apnea diagnosed by sleep study in 2011 with an AHI in REM sleep with AHI 9/h.  HST done in 2013 showed no obstructive sleep apnea with an AHI of 2/h and no oxygen desaturations and stopped her CPAP therapy.      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  Social History:  reports that she has never smoked. She has never used smokeless tobacco. She reports that she does not drink alcohol and does not use drugs.  ALLERGIES/MEDS:  Allergies: No Known Allergies  No medications prior to admission.     Review of Systems  All other systems reviewed and are negative.   There were no vitals taken for this visit. Gen:  NAD, pleasant and cooperative Cardio:  RRR Pulm:  CTAB, no wheezes/rales/rhonchi Abd:  Soft, non-distended, non-tender throughout, no rebound/guarding Ext:  No bilateral LE edema, no bilateral calf tenderness Pelvic: Labia - unremarkable, vagina - pink moist mucosa, no lesions or abnormal discharge, cervix - no discharge or lesions or CMT, adnexa - no masses or tenderness - uterus non-tender and normal size on palpation  No results found for this or any previous visit (from the past 24 hours).  No results found.   ASSESSMENT/PLAN: Sydney Chapman is a 58 y.o. G3P3 who is admitted for Robotic Assisted Total Laparoscopic Hysterectomy with bilateral salpingo-oophorectomy fo endometrial hyperplasia.  - Admit to [facility] Main OR - Admit labs (CBC, T&S) - Diet:  ERAS pathway/per anesthesia - IVF:  Per anesthesia - VTE Prophylaxis:  SCDs - Antibiotics: Ancef 2g on call to OR - D/C home same-day or POD#1  Consents: I have explained to the patient that his surgery is performed to remove the uterus through several small incisions in the abdomen and that it will result in sterility.  I discussed the risks and benefits of the surgery, including, but not limited to bleeding, including the need for blood transfusion, infection, damage to surround organs and tissues, damage to bladder, damage to ureters, causing kidney damage, and requiring additional  procedures, damage to bowels, resulting in further surgery, postoperative pain, short-term and long-term, scarring on the abdominal wall and intra-abdominally, need for further surgery, need for conversion to an open procedure, development of an incisional hernia, deep vein thrombosis, and/or pulmonary embolism, wound infection and/or separation, painful intercourse, urinary leakage, ovarian failure, resulting in menopausal symptoms requiring treatment, fistula formation, complications the course of which cannot be predicted or prevented, and death. Patient was consented for blood products.  The patient is aware that bleeding may result in the need for a blood transfusion which includes risk of transmission of HIV (1:2 million), Hepatitis C (1:2 million), and Hepatitis B (1:200 thousand) and transfusion reaction.  Patient voiced understanding of the above risks as well as understanding of indications for blood transfusion.    Steva Ready, DO

## 2023-12-13 ENCOUNTER — Encounter (HOSPITAL_COMMUNITY): Payer: Self-pay | Admitting: Obstetrics and Gynecology

## 2023-12-18 ENCOUNTER — Encounter (HOSPITAL_COMMUNITY): Payer: Self-pay | Admitting: Obstetrics and Gynecology

## 2023-12-18 NOTE — Pre-Procedure Instructions (Addendum)
 Surgical Instructions   Your procedure is scheduled on : Tuesday,  12-26-2023. Report to [facility] Main Entrance "A" at 8:30  A.M., then check in with the Admitting office. Any questions or running late day of surgery: call [phone]  Questions prior to your surgery date: call [phone] or [phone] , Monday-Friday, 8am-4pm. If you experience any cold or flu symptoms such as cough, fever, chills, shortness of breath, etc. between now and your scheduled surgery, please notify your surgeon office.    Remember:  Do not eat food after midnight the night before your surgery. You may have clear liquids from midnight night before surgery until 7:30 AM.   You may drink clear liquids until 7:30 AM the morning of your surgery.   Clear liquids allowed are: Water Carbonated Beverages (diet, low sugar) Clear Tea (non-sweetener, no milk, honey, etc.) Black Coffee Only (use non-sweetener, NO MILK, CREAM OR POWDERED CREAMER of any kind) Gatorade (low sugar) NO clear liquids after 7:30 AM day of surgery.  This includes no water,  candy,  gum,  and  mints.    Take these medicines the morning of surgery with A SIP OF WATER : none   May take these medicines IF NEEDED:  Systane eye drops    One week prior to surgery, STOP taking any Aspirin (unless otherwise instructed by your surgeon) Aleve, Naproxen, Ibuprofen, Motrin, Advil, Goody's, BC's, all herbal medications, fish oil, and non-prescription vitamins.                     Do NOT Smoke (Tobacco/Vaping) for 24 hours prior to your procedure.  Bring your CPAP/ mask/ tubing day of surgery.  Leave in car if needed in recovery nurse will call your family to get it.  If you stay the night, you will need to  bring in  for your overnight stay.   You will be asked to remove any contacts (bring solution / container), glasses, piercing's, hearing aid's, dentures/partials prior to surgery. Please bring cases for these items if needed.    Patients  discharged the day of surgery will not be allowed to drive home, and someone needs to stay with them for 24 hours.  SURGICAL WAITING ROOM VISITATION Patients may have no more than 2 support people in the waiting area - these visitors may rotate.   Pre-op nurse will coordinate an appropriate time for 1 ADULT support person, who may not rotate, to accompany patient in pre-op.  Children under the age of 76 must have an adult with them who is not the patient and must remain in the main waiting area with an adult.  If the patient needs to stay at the hospital during part of their recovery, the visitor guidelines for inpatient rooms apply.  Please refer to the [facility] website for the visitor guidelines for any additional information.   If you received a COVID test during your pre-op visit  it is requested that you wear a mask when out in public, stay away from anyone that may not be feeling well and notify your surgeon if you develop symptoms. If you have been in contact with anyone that has tested positive in the last 10 days please notify you surgeon.      Pre-operative CHG Bathing Instructions   You can play a key role in reducing the risk of infection after surgery. Your skin needs to be as free of germs as possible. You can reduce the number of germs on your  skin by washing with CHG (chlorhexidine gluconate) soap before surgery. CHG is an antiseptic soap that kills germs and continues to kill germs even after washing.   DO NOT use if you have an allergy to chlorhexidine/CHG or antibacterial soaps. If your skin becomes reddened or irritated, stop using the CHG and notify Pre-Op day of surgery.  If you have any skin irritation or problems with the surgical soap (CHG), do not use.  Please get a bar of gold dial soap or antibacterial soap and shower following the instructions below.             TAKE A SHOWER THE NIGHT BEFORE SURGERY AND THE DAY OF SURGERY    Please keep in mind the  following:  DO NOT shave, including legs and underarms, 48 hours prior to surgery.   You may shave your face before/day of surgery.  Place clean sheets on your bed the night before surgery Use a clean washcloth (not used since being washed) for each shower. DO NOT sleep with pet's night before surgery.  CHG Shower Instructions:  Wash your face and private area with normal soap. If you choose to wash your hair, wash first with your normal shampoo.  After you use shampoo/soap, rinse your hair and body thoroughly to remove shampoo/soap residue.  Turn the water OFF and apply half the bottle of CHG soap to a CLEAN washcloth.  Apply CHG soap ONLY FROM YOUR NECK DOWN TO YOUR TOES (washing for 3-5 minutes)  DO NOT use CHG soap on face, private areas, open wounds, or sores.  Pay special attention to the area where your surgery is being performed.  If you are having back surgery, having someone wash your back for you may be helpful. Wait 2 minutes after CHG soap is applied, then you may rinse off the CHG soap.  Pat dry with a clean towel  Put on clean pajamas    Additional instructions for the day of surgery: DO NOT APPLY any lotions, oils, cologne/ perfumes or makeup Do not wear jewelry / piercing's/  metal/  permanent jewelry must be removed prior to arrival. Do not wear nail polish, gel polish, artificial nails, or any other type of covering on natural nails (fingers and toes) Do not bring valuables to the hospital. [facility] is not responsible for valuables/personal belongings. Put on clean/comfortable clothes.  Please brush your teeth.  Ask your nurse before applying any prescription medications to the skin.

## 2023-12-18 NOTE — Progress Notes (Signed)
 Spoke w/ via phone for pre-op interview--- pt Lab needs dos----     no    Lab results------ lab appt 12-20-2023 @ 1100 getting CBC/ BMP/ T&S/ EKG COVID test -----patient states asymptomatic no test needed Arrive at ------- 0830 on 12-26-2023 NPO after MN NO Solid Food.  Clear liquids from MN until--- 0730 Pre-Surgery Ensure or G2: n/a ERAS protocol  Med rec completed Medications to take morning of surgery -----  none Diabetic medication -----  n/a  GLP1 agonist last dose:  n/a GLP1 instructions:  Patient instructed no nail polish to be worn day of surgery Patient instructed to bring photo id and insurance card day of surgery Patient aware to have Driver (ride ) / caregiver    for 24 hours after surgery -  husband, daniel Patient Special Instructions -----  will pick up bag w/ CHG and written instructions at lab appt Asked to bring cpap/ mask/ tubing dos of surgery and leave in car , told if needed nurse will ask family to get from car  Pre-Op special Instructions -----  n/a  Patient verbalized understanding of instructions that were given at this phone interview. Patient denies chest pain, sob, fever, cough at the interview.    Anesthesia Review:  no,  HTN;  mild OSA,  stated uses cpap nightly;  hypothyroidism s/p post surgical;  CKD 3;  pre-diabtes  PCP:  Dr Manus Gunning Endocrinologist:  Dr Roanna Raider Deboraha Sprang) Nephrologist:  Dr Thedore Mins  Chest x-ray :  no EKG : no Echo : no Stress test:  no Cardiac Cath :  no  Activity level:  denies w/ any normal acitivity Sleep Study/ CPAP :  yes in epic 06-26-2010 Fasting Blood Sugar :      / Checks Blood Sugar -- times a day:  does not check  Blood Thinner/ Instructions /Last Dose:  no ASA / Instructions/ Last Dose : no

## 2023-12-20 ENCOUNTER — Encounter (HOSPITAL_COMMUNITY)
Admission: RE | Admit: 2023-12-20 | Discharge: 2023-12-20 | Disposition: A | Discharge status: Home / Self Care | Source: Ambulatory Visit | Attending: Obstetrics and Gynecology | Admitting: Obstetrics and Gynecology

## 2023-12-20 DIAGNOSIS — Z01812 Encounter for preprocedural laboratory examination: Secondary | ICD-10-CM | POA: Diagnosis present

## 2023-12-20 DIAGNOSIS — Z01810 Encounter for preprocedural cardiovascular examination: Secondary | ICD-10-CM | POA: Diagnosis present

## 2023-12-20 DIAGNOSIS — Z01818 Encounter for other preprocedural examination: Secondary | ICD-10-CM | POA: Diagnosis not present

## 2023-12-20 DIAGNOSIS — N8500 Endometrial hyperplasia, unspecified: Secondary | ICD-10-CM | POA: Diagnosis not present

## 2023-12-20 LAB — CBC
HCT: 46.2 % — ABNORMAL HIGH (ref 36.0–46.0)
Hemoglobin: 15.8 g/dL — ABNORMAL HIGH (ref 12.0–15.0)
MCH: 29.9 pg (ref 26.0–34.0)
MCHC: 34.2 g/dL (ref 30.0–36.0)
MCV: 87.3 fL (ref 80.0–100.0)
Platelets: 323 10*3/uL (ref 150–400)
RBC: 5.29 MIL/uL — ABNORMAL HIGH (ref 3.87–5.11)
RDW: 12.9 % (ref 11.5–15.5)
WBC: 7 10*3/uL (ref 4.0–10.5)
nRBC: 0 % (ref 0.0–0.2)

## 2023-12-20 LAB — TYPE AND SCREEN
ABO/RH(D): O POS
Antibody Screen: NEGATIVE

## 2023-12-20 LAB — BASIC METABOLIC PANEL
Anion gap: 8 (ref 5–15)
BUN: 16 mg/dL (ref 6–20)
CO2: 28 mmol/L (ref 22–32)
Calcium: 9.2 mg/dL (ref 8.9–10.3)
Chloride: 104 mmol/L (ref 98–111)
Creatinine, Ser: 1.26 mg/dL — ABNORMAL HIGH (ref 0.44–1.00)
GFR, Estimated: 49 mL/min — ABNORMAL LOW (ref >60)
Glucose, Bld: 113 mg/dL — ABNORMAL HIGH (ref 70–99)
Potassium: 3.9 mmol/L (ref 3.5–5.1)
Sodium: 140 mmol/L (ref 135–145)

## 2023-12-26 ENCOUNTER — Encounter (HOSPITAL_COMMUNITY): Payer: Self-pay | Admitting: Obstetrics and Gynecology

## 2023-12-26 ENCOUNTER — Encounter (HOSPITAL_COMMUNITY)
Admission: RE | Disposition: A | Discharge status: Home / Self Care | Payer: Self-pay | Source: Ambulatory Visit | Attending: Obstetrics and Gynecology

## 2023-12-26 ENCOUNTER — Ambulatory Visit (HOSPITAL_BASED_OUTPATIENT_CLINIC_OR_DEPARTMENT_OTHER)

## 2023-12-26 ENCOUNTER — Ambulatory Visit (HOSPITAL_COMMUNITY)
Admission: RE | Admit: 2023-12-26 | Discharge: 2023-12-27 | Disposition: A | Discharge status: Home / Self Care | Payer: 59 | Source: Ambulatory Visit | Attending: Obstetrics and Gynecology | Admitting: Obstetrics and Gynecology

## 2023-12-26 ENCOUNTER — Other Ambulatory Visit: Payer: Self-pay

## 2023-12-26 ENCOUNTER — Ambulatory Visit (HOSPITAL_COMMUNITY)

## 2023-12-26 DIAGNOSIS — K660 Peritoneal adhesions (postprocedural) (postinfection): Secondary | ICD-10-CM

## 2023-12-26 DIAGNOSIS — N8502 Endometrial intraepithelial neoplasia [EIN]: Secondary | ICD-10-CM | POA: Diagnosis not present

## 2023-12-26 DIAGNOSIS — E039 Hypothyroidism, unspecified: Secondary | ICD-10-CM

## 2023-12-26 DIAGNOSIS — I10 Essential (primary) hypertension: Secondary | ICD-10-CM

## 2023-12-26 DIAGNOSIS — N8500 Endometrial hyperplasia, unspecified: Secondary | ICD-10-CM | POA: Diagnosis present

## 2023-12-26 DIAGNOSIS — N939 Abnormal uterine and vaginal bleeding, unspecified: Secondary | ICD-10-CM | POA: Diagnosis not present

## 2023-12-26 DIAGNOSIS — Z01818 Encounter for other preprocedural examination: Secondary | ICD-10-CM

## 2023-12-26 HISTORY — PX: CYSTOSCOPY: SHX5120

## 2023-12-26 HISTORY — DX: Stress incontinence (female) (male): N39.3

## 2023-12-26 HISTORY — DX: Chronic kidney disease, stage 3 unspecified: N18.30

## 2023-12-26 HISTORY — DX: Presence of spectacles and contact lenses: Z97.3

## 2023-12-26 HISTORY — PX: ROBOTIC ASSISTED TOTAL HYSTERECTOMY WITH BILATERAL SALPINGO OOPHERECTOMY: SHX6086

## 2023-12-26 HISTORY — DX: Unspecified osteoarthritis, unspecified site: M19.90

## 2023-12-26 HISTORY — DX: Prediabetes: R73.03

## 2023-12-26 HISTORY — DX: Mixed hyperlipidemia: E78.2

## 2023-12-26 HISTORY — DX: Dry eye syndrome of bilateral lacrimal glands: H04.123

## 2023-12-26 HISTORY — DX: Gastro-esophageal reflux disease without esophagitis: K21.9

## 2023-12-26 HISTORY — DX: Essential (primary) hypertension: I10

## 2023-12-26 HISTORY — DX: Obstructive sleep apnea (adult) (pediatric): G47.33

## 2023-12-26 HISTORY — DX: Endometrial hyperplasia, unspecified: N85.00

## 2023-12-26 LAB — ABO/RH: ABO/RH(D): O POS

## 2023-12-26 SURGERY — HYSTERECTOMY, TOTAL, ROBOT-ASSISTED, LAPAROSCOPIC, WITH BILATERAL SALPINGO-OOPHORECTOMY
Anesthesia: General | Site: Bladder

## 2023-12-26 MED ORDER — CEFAZOLIN SODIUM-DEXTROSE 2-4 GM/100ML-% IV SOLN
INTRAVENOUS | Status: AC
  Filled 2023-12-26: qty 100

## 2023-12-26 MED ORDER — EPHEDRINE 5 MG/ML INJ
INTRAVENOUS | Status: AC
  Filled 2023-12-26: qty 10

## 2023-12-26 MED ORDER — MIDAZOLAM HCL 5 MG/5ML IJ SOLN
INTRAMUSCULAR | Status: DC | PRN
  Administered 2023-12-26: 2 mg via INTRAVENOUS

## 2023-12-26 MED ORDER — SODIUM CHLORIDE 0.9 % IV SOLN
INTRAVENOUS | Status: DC | PRN
  Administered 2023-12-26: 90 mL

## 2023-12-26 MED ORDER — DEXMEDETOMIDINE HCL IN NACL 80 MCG/20ML IV SOLN
INTRAVENOUS | Status: DC | PRN
  Administered 2023-12-26: 8 ug via INTRAVENOUS
  Administered 2023-12-26: 4 ug via INTRAVENOUS

## 2023-12-26 MED ORDER — DEXAMETHASONE SODIUM PHOSPHATE 10 MG/ML IJ SOLN
INTRAMUSCULAR | Status: AC
  Filled 2023-12-26: qty 3

## 2023-12-26 MED ORDER — PHENYLEPHRINE 80 MCG/ML (10ML) SYRINGE FOR IV PUSH (FOR BLOOD PRESSURE SUPPORT)
PREFILLED_SYRINGE | INTRAVENOUS | Status: DC | PRN
  Administered 2023-12-26 (×2): 80 ug via INTRAVENOUS
  Administered 2023-12-26 (×2): 160 ug via INTRAVENOUS

## 2023-12-26 MED ORDER — ONDANSETRON HCL 4 MG/2ML IJ SOLN: 4.0000 mg | Freq: Four times a day (QID) | INTRAMUSCULAR | Status: DC | PRN

## 2023-12-26 MED ORDER — CEFAZOLIN SODIUM-DEXTROSE 1-4 GM/50ML-% IV SOLN
INTRAVENOUS | Status: AC
  Filled 2023-12-26: qty 50

## 2023-12-26 MED ORDER — MIDAZOLAM HCL 2 MG/2ML IJ SOLN
INTRAMUSCULAR | Status: AC
  Filled 2023-12-26: qty 2

## 2023-12-26 MED ORDER — METHYLENE BLUE (ANTIDOTE) 1 % IV SOLN
INTRAVENOUS | Status: AC
Start: 2023-12-26 — End: ?
  Filled 2023-12-26: qty 10

## 2023-12-26 MED ORDER — FENTANYL CITRATE (PF) 250 MCG/5ML IJ SOLN
INTRAMUSCULAR | Status: AC
  Filled 2023-12-26: qty 5

## 2023-12-26 MED ORDER — CHLORHEXIDINE GLUCONATE 0.12 % MT SOLN
15.0000 mL | Freq: Once | OROMUCOSAL | Status: AC
  Administered 2023-12-26: 15 mL via OROMUCOSAL

## 2023-12-26 MED ORDER — PHENYLEPHRINE 80 MCG/ML (10ML) SYRINGE FOR IV PUSH (FOR BLOOD PRESSURE SUPPORT)
PREFILLED_SYRINGE | INTRAVENOUS | Status: AC
  Filled 2023-12-26: qty 20

## 2023-12-26 MED ORDER — OXYCODONE HCL 5 MG PO TABS
5.0000 mg | ORAL_TABLET | ORAL | Status: DC | PRN
Start: 2023-12-26 — End: 2023-12-27
  Administered 2023-12-26: 10 mg via ORAL
  Filled 2023-12-26: qty 2

## 2023-12-26 MED ORDER — CEFAZOLIN SODIUM-DEXTROSE 3-4 GM/150ML-% IV SOLN
3.0000 g | INTRAVENOUS | Status: AC
  Administered 2023-12-26: 3 g via INTRAVENOUS

## 2023-12-26 MED ORDER — STERILE WATER FOR IRRIGATION IR SOLN
Status: DC | PRN
  Administered 2023-12-26: 1000 mL

## 2023-12-26 MED ORDER — IBUPROFEN 800 MG PO TABS: 800.0000 mg | ORAL_TABLET | Freq: Three times a day (TID) | ORAL | 0 refills | Status: AC | PRN

## 2023-12-26 MED ORDER — DEXAMETHASONE SODIUM PHOSPHATE 10 MG/ML IJ SOLN
INTRAMUSCULAR | Status: DC | PRN
  Administered 2023-12-26: 5 mg via INTRAVENOUS

## 2023-12-26 MED ORDER — GLYCOPYRROLATE 0.2 MG/ML IJ SOLN
INTRAMUSCULAR | Status: DC | PRN
  Administered 2023-12-26: .2 mg via INTRAVENOUS

## 2023-12-26 MED ORDER — SODIUM CHLORIDE 0.9 % IR SOLN
Status: DC | PRN
  Administered 2023-12-26 (×2): 1000 mL

## 2023-12-26 MED ORDER — PROPOFOL 10 MG/ML IV BOLUS
INTRAVENOUS | Status: AC
  Filled 2023-12-26: qty 20

## 2023-12-26 MED ORDER — ACETAMINOPHEN 500 MG PO TABS
1000.0000 mg | ORAL_TABLET | Freq: Four times a day (QID) | ORAL | Status: DC
  Administered 2023-12-26 – 2023-12-27 (×3): 1000 mg via ORAL
  Filled 2023-12-26 (×3): qty 2

## 2023-12-26 MED ORDER — LEVOTHYROXINE SODIUM 75 MCG PO TABS
175.0000 ug | ORAL_TABLET | Freq: Every day | ORAL | Status: DC
  Administered 2023-12-27: 175 ug via ORAL
  Filled 2023-12-26: qty 2

## 2023-12-26 MED ORDER — ORAL CARE MOUTH RINSE: 15.0000 mL | Freq: Once | OROMUCOSAL | Status: AC

## 2023-12-26 MED ORDER — MENTHOL 3 MG MT LOZG: 1.0000 | LOZENGE | OROMUCOSAL | Status: DC | PRN

## 2023-12-26 MED ORDER — ONDANSETRON HCL 4 MG/2ML IJ SOLN
INTRAMUSCULAR | Status: AC
  Filled 2023-12-26: qty 6

## 2023-12-26 MED ORDER — SUGAMMADEX SODIUM 200 MG/2ML IV SOLN
INTRAVENOUS | Status: AC
  Filled 2023-12-26: qty 4

## 2023-12-26 MED ORDER — SODIUM CHLORIDE 0.9 % IV SOLN: INTRAVENOUS | Status: DC

## 2023-12-26 MED ORDER — OXYCODONE HCL 5 MG PO TABS: 5.0000 mg | ORAL_TABLET | Freq: Once | ORAL | Status: DC | PRN

## 2023-12-26 MED ORDER — SIMETHICONE 80 MG PO CHEW: 80.0000 mg | CHEWABLE_TABLET | Freq: Four times a day (QID) | ORAL | Status: DC | PRN

## 2023-12-26 MED ORDER — SUGAMMADEX SODIUM 200 MG/2ML IV SOLN
INTRAVENOUS | Status: DC | PRN
  Administered 2023-12-26: 200 mg via INTRAVENOUS

## 2023-12-26 MED ORDER — ROCURONIUM BROMIDE 10 MG/ML (PF) SYRINGE
PREFILLED_SYRINGE | INTRAVENOUS | Status: AC
  Filled 2023-12-26: qty 60

## 2023-12-26 MED ORDER — FENTANYL CITRATE (PF) 100 MCG/2ML IJ SOLN: 25.0000 ug | INTRAMUSCULAR | Status: DC | PRN

## 2023-12-26 MED ORDER — IBUPROFEN 200 MG PO TABS
800.0000 mg | ORAL_TABLET | Freq: Three times a day (TID) | ORAL | Status: DC
  Administered 2023-12-26 – 2023-12-27 (×2): 800 mg via ORAL
  Filled 2023-12-26 (×2): qty 4

## 2023-12-26 MED ORDER — SODIUM CHLORIDE (PF) 0.9 % IJ SOLN
INTRAMUSCULAR | Status: AC
  Filled 2023-12-26: qty 50

## 2023-12-26 MED ORDER — LACTATED RINGERS IV SOLN
INTRAVENOUS | Status: DC | PRN
Start: 2023-12-26 — End: 2023-12-26

## 2023-12-26 MED ORDER — LIDOCAINE 2% (20 MG/ML) 5 ML SYRINGE
INTRAMUSCULAR | Status: DC | PRN
  Administered 2023-12-26: 60 mg via INTRAVENOUS

## 2023-12-26 MED ORDER — CHLORHEXIDINE GLUCONATE 0.12 % MT SOLN
OROMUCOSAL | Status: AC
  Filled 2023-12-26: qty 15

## 2023-12-26 MED ORDER — ONDANSETRON HCL 4 MG/2ML IJ SOLN
INTRAMUSCULAR | Status: DC | PRN
  Administered 2023-12-26: 4 mg via INTRAVENOUS

## 2023-12-26 MED ORDER — ONDANSETRON HCL 4 MG PO TABS: 4.0000 mg | ORAL_TABLET | Freq: Four times a day (QID) | ORAL | Status: DC | PRN

## 2023-12-26 MED ORDER — SUGAMMADEX SODIUM 200 MG/2ML IV SOLN
INTRAVENOUS | Status: AC
  Filled 2023-12-26: qty 2

## 2023-12-26 MED ORDER — ONDANSETRON HCL 4 MG/2ML IJ SOLN
INTRAMUSCULAR | Status: AC
  Administered 2023-12-26: 4 mg
  Filled 2023-12-26: qty 2

## 2023-12-26 MED ORDER — ROPIVACAINE HCL 5 MG/ML IJ SOLN
INTRAMUSCULAR | Status: AC
  Filled 2023-12-26: qty 60

## 2023-12-26 MED ORDER — TRANEXAMIC ACID-NACL 1000-0.7 MG/100ML-% IV SOLN
INTRAVENOUS | Status: DC | PRN
  Administered 2023-12-26: 1000 mg via INTRAVENOUS

## 2023-12-26 MED ORDER — FENTANYL CITRATE (PF) 100 MCG/2ML IJ SOLN
INTRAMUSCULAR | Status: DC | PRN
  Administered 2023-12-26: 50 ug via INTRAVENOUS
  Administered 2023-12-26: 25 ug via INTRAVENOUS
  Administered 2023-12-26: 50 ug via INTRAVENOUS
  Administered 2023-12-26: 100 ug via INTRAVENOUS
  Administered 2023-12-26: 25 ug via INTRAVENOUS

## 2023-12-26 MED ORDER — LIDOCAINE 2% (20 MG/ML) 5 ML SYRINGE
INTRAMUSCULAR | Status: AC
  Filled 2023-12-26: qty 15

## 2023-12-26 MED ORDER — HEMOSTATIC AGENTS (NO CHARGE) OPTIME
TOPICAL | Status: DC | PRN
  Administered 2023-12-26: 3

## 2023-12-26 MED ORDER — SODIUM CHLORIDE 0.9 % IR SOLN
Status: DC | PRN
  Administered 2023-12-26: 1000 mL

## 2023-12-26 MED ORDER — PROPOFOL 10 MG/ML IV BOLUS
INTRAVENOUS | Status: DC | PRN
  Administered 2023-12-26: 200 mg via INTRAVENOUS

## 2023-12-26 MED ORDER — ROCURONIUM BROMIDE 10 MG/ML (PF) SYRINGE
PREFILLED_SYRINGE | INTRAVENOUS | Status: DC | PRN
  Administered 2023-12-26: 10 mg via INTRAVENOUS
  Administered 2023-12-26: 20 mg via INTRAVENOUS
  Administered 2023-12-26 (×2): 10 mg via INTRAVENOUS
  Administered 2023-12-26: 60 mg via INTRAVENOUS
  Administered 2023-12-26: 10 mg via INTRAVENOUS
  Administered 2023-12-26: 40 mg via INTRAVENOUS

## 2023-12-26 MED ORDER — OXYCODONE HCL 5 MG/5ML PO SOLN: 5.0000 mg | Freq: Once | ORAL | Status: DC | PRN

## 2023-12-26 MED ORDER — LACTATED RINGERS IV SOLN: INTRAVENOUS | Status: DC

## 2023-12-26 MED ORDER — EPHEDRINE SULFATE (PRESSORS) 50 MG/ML IJ SOLN
INTRAMUSCULAR | Status: DC | PRN
  Administered 2023-12-26: 2.5 mg via INTRAVENOUS

## 2023-12-26 MED ORDER — TRANEXAMIC ACID-NACL 1000-0.7 MG/100ML-% IV SOLN
INTRAVENOUS | Status: AC
  Filled 2023-12-26: qty 100

## 2023-12-26 MED ORDER — METHYLENE BLUE (ANTIDOTE) 1 % IV SOLN
INTRAVENOUS | Status: DC | PRN
Start: 2023-12-26 — End: 2023-12-26
  Administered 2023-12-26: 5 mL

## 2023-12-26 MED ORDER — OXYCODONE HCL 5 MG PO TABS: 5.0000 mg | ORAL_TABLET | Freq: Four times a day (QID) | ORAL | 0 refills | Status: AC | PRN

## 2023-12-26 MED ORDER — SODIUM CHLORIDE (PF) 0.9 % IJ SOLN
INTRAMUSCULAR | Status: AC
  Filled 2023-12-26: qty 10

## 2023-12-26 MED ORDER — DOCUSATE SODIUM 100 MG PO CAPS
100.0000 mg | ORAL_CAPSULE | Freq: Two times a day (BID) | ORAL | Status: DC
  Administered 2023-12-27: 100 mg via ORAL
  Filled 2023-12-26: qty 1

## 2023-12-26 SURGICAL SUPPLY — 62 items
APPLICATOR ARISTA FLEXITIP XL (MISCELLANEOUS) IMPLANT
BARRIER ADHS 3X4 INTERCEED (GAUZE/BANDAGES/DRESSINGS) IMPLANT
CANNULA CAP OBTURATR AIRSEAL 8 (CAP) ×2 IMPLANT
COVER BACK TABLE 60X90IN (DRAPES) ×2 IMPLANT
COVER TIP SHEARS 8 DVNC (MISCELLANEOUS) ×2 IMPLANT
DEFOGGER SCOPE WARMER CLEARIFY (MISCELLANEOUS) ×2 IMPLANT
DERMABOND ADVANCED .7 DNX12 (GAUZE/BANDAGES/DRESSINGS) ×2 IMPLANT
DERMABOND ADVANCED .7 DNX6 (GAUZE/BANDAGES/DRESSINGS) IMPLANT
DILATOR CANAL MILEX (MISCELLANEOUS) IMPLANT
DRAPE ARM DVNC X/XI (DISPOSABLE) ×8 IMPLANT
DRAPE COLUMN DVNC XI (DISPOSABLE) ×2 IMPLANT
DRAPE SURG IRRIG POUCH 19X23 (DRAPES) ×2 IMPLANT
DRAPE UTILITY W/TAPE 26X15 (DRAPES) ×2 IMPLANT
DRIVER NDL MEGA 8 DVNC XI (INSTRUMENTS) ×2 IMPLANT
DRIVER NDLE MEGA DVNC XI (INSTRUMENTS) ×2 IMPLANT
DURAPREP 26ML APPLICATOR (WOUND CARE) ×2 IMPLANT
ELECT REM PT RETURN 9FT ADLT (ELECTROSURGICAL) ×2 IMPLANT
ELECTRODE REM PT RTRN 9FT ADLT (ELECTROSURGICAL) ×2 IMPLANT
FORCEPS BPLR LNG DVNC XI (INSTRUMENTS) ×2 IMPLANT
FORCEPS LONG TIP 8 DVNC XI (FORCEP) ×2 IMPLANT
FORCEPS PROGRASP DVNC XI (FORCEP) ×2 IMPLANT
GAUZE 4X4 16PLY ~~LOC~~+RFID DBL (SPONGE) IMPLANT
GLOVE BIO SURGEON STRL SZ 6.5 (GLOVE) ×6 IMPLANT
GLOVE BIOGEL PI IND STRL 7.0 (GLOVE) ×10 IMPLANT
HEMOSTAT ARISTA ABSORB 3G PWDR (HEMOSTASIS) IMPLANT
HIBICLENS CHG 4% 4OZ BTL (MISCELLANEOUS) ×4 IMPLANT
IRRIGATION STRYKERFLOW (MISCELLANEOUS) ×2 IMPLANT
IRRIGATOR STRYKERFLOW (MISCELLANEOUS) ×2 IMPLANT
KIT PINK PAD W/HEAD ARE REST (MISCELLANEOUS) ×2 IMPLANT
KIT PINK PAD W/HEAD ARM REST (MISCELLANEOUS) ×2 IMPLANT
LEGGING LITHOTOMY PAIR STRL (DRAPES) ×2 IMPLANT
OBTURATOR OPTICAL STND 8 DVNC (TROCAR) ×2 IMPLANT
OBTURATOR OPTICALSTD 8 DVNC (TROCAR) ×2 IMPLANT
OCCLUDER COLPOPNEUMO (BALLOONS) ×2 IMPLANT
PACK ROBOT WH (CUSTOM PROCEDURE TRAY) ×2 IMPLANT
PACK ROBOTIC GOWN (GOWN DISPOSABLE) ×2 IMPLANT
PAD OB MATERNITY 11 LF (PERSONAL CARE ITEMS) ×2 IMPLANT
POWDER SURGICEL 3.0 GRAM (HEMOSTASIS) IMPLANT
RETRACTOR WND ALEXIS 18 MED (MISCELLANEOUS) IMPLANT
RTRCTR WOUND ALEXIS 18CM MED (MISCELLANEOUS) IMPLANT
RTRCTR WOUND ALEXIS 18CM SML (INSTRUMENTS) IMPLANT
SAVER CELL AAL HAEMONETICS (INSTRUMENTS) IMPLANT
SCISSORS MNPLR CVD DVNC XI (INSTRUMENTS) ×2 IMPLANT
SEAL UNIV 5-12 XI (MISCELLANEOUS) ×8 IMPLANT
SEALER VESSEL EXT DVNC XI (MISCELLANEOUS) ×2 IMPLANT
SET IRRIG Y TYPE TUR BLADDER L (SET/KITS/TRAYS/PACK) IMPLANT
SET TUBE FILTERED XL AIRSEAL (SET/KITS/TRAYS/PACK) ×2 IMPLANT
SPIKE FLUID TRANSFER (MISCELLANEOUS) ×8 IMPLANT
SUT VIC AB 0 CT1 27XBRD ANBCTR (SUTURE) ×4 IMPLANT
SUT VICRYL 0 UR6 27IN ABS (SUTURE) IMPLANT
SUT VICRYL RAPIDE 4/0 PS 2 (SUTURE) ×4 IMPLANT
SUT VLOC 180 0 9IN GS21 (SUTURE) ×2 IMPLANT
SYR 3ML 23GX1 SAFETY (SYRINGE) IMPLANT
TIP ENDOSCOPIC SURGICEL (TIP) IMPLANT
TIP RUMI ORANGE 6.7MMX12CM (TIP) IMPLANT
TIP UTERINE 5.1X6CM LAV DISP (MISCELLANEOUS) IMPLANT
TIP UTERINE 6.7X10CM GRN DISP (MISCELLANEOUS) IMPLANT
TIP UTERINE 6.7X6CM WHT DISP (MISCELLANEOUS) IMPLANT
TIP UTERINE 6.7X8CM BLUE DISP (MISCELLANEOUS) IMPLANT
TOWEL GREEN STERILE (TOWEL DISPOSABLE) ×2 IMPLANT
TRAY FOLEY MTR SLVR 14FR STAT (SET/KITS/TRAYS/PACK) ×2 IMPLANT
UNDERPAD 30X36 HEAVY ABSORB (UNDERPADS AND DIAPERS) ×2 IMPLANT

## 2023-12-26 NOTE — Anesthesia Preprocedure Evaluation (Signed)
 Anesthesia Evaluation  Patient identified by MRN, date of birth, ID band Patient awake    Reviewed: Allergy & Precautions, H&P , NPO status , Patient's Chart, lab work & pertinent test results  Airway Mallampati: II   Neck ROM: full    Dental   Pulmonary sleep apnea    breath sounds clear to auscultation       Cardiovascular hypertension,  Rhythm:regular Rate:Normal     Neuro/Psych    GI/Hepatic ,GERD  ,,  Endo/Other  Hypothyroidism    Renal/GU Renal InsufficiencyRenal disease     Musculoskeletal  (+) Arthritis ,    Abdominal   Peds  Hematology   Anesthesia Other Findings   Reproductive/Obstetrics                             Anesthesia Physical Anesthesia Plan  ASA: 3  Anesthesia Plan: General   Post-op Pain Management:    Induction: Intravenous  PONV Risk Score and Plan: 3 and Ondansetron, Dexamethasone, Midazolam and Treatment may vary due to age or medical condition  Airway Management Planned: Oral ETT  Additional Equipment:   Intra-op Plan:   Post-operative Plan: Extubation in OR  Informed Consent: I have reviewed the patients History and Physical, chart, labs and discussed the procedure including the risks, benefits and alternatives for the proposed anesthesia with the patient or authorized representative who has indicated his/her understanding and acceptance.     Dental advisory given  Plan Discussed with: CRNA, Anesthesiologist and Surgeon  Anesthesia Plan Comments:        Anesthesia Quick Evaluation

## 2023-12-26 NOTE — Interval H&P Note (Signed)
 History and Physical Interval Note:  12/26/2023 10:48 AM  Sydney Chapman  has presented today for surgery, with the diagnosis of Endometrial Hyperplasia.  The various methods of treatment have been discussed with the patient and family. After consideration of risks, benefits and other options for treatment, the patient has consented to  Procedure(s) with comments: XI ROBOTIC ASSISTED TOTAL LAPAROSCOPIC HYSTERECTOMY WITH BILATERAL SALPINGO OOPHORECTOMY (Bilateral) [facility] as a surgical intervention.  The patient's history has been reviewed, patient examined, no change in status, stable for surgery.  I have reviewed the patient's chart and labs.  Questions were answered to the patient's satisfaction.     Steva Ready

## 2023-12-26 NOTE — Progress Notes (Signed)
 GYN Post-Op Note  Subjective:  Just rolled to floor. Patient is doing well. Pain is well-controlled. Feels she needs to void now. Denies fevers, chills, chest pain, SOB.  Objective: BP 113/74   Pulse 74   Temp 97.6 F (36.4 C)   Resp 16   Ht 5\' 7"  (1.702 m)   Wt 126.1 kg   LMP 06/13/2012   SpO2 93%   BMI 43.54 kg/m  Gen:  NAD, pleasant and cooperative Ext:  No bilateral LE edema, no bilateral calf tenderness, SCDs on an working   A/P: POD#0 s/p RA-TLH/BSO and Cystoscopy.  - Doing well post-operatively - Pulm:  RA - GI:  DAT  Prophylaxis: Pepcid  - Renal:  Awaiting PFV - IVF:  LR at 100cc/hour - Pain:  Tylenol/Motrin/Roxicodone and Morphine PRN - DVT Prophylaxis:  SCDs, Ambulation - Activity:  Ad lib - Labs:  None  Disposition: Possibly this evening or tomorrow.  Steva Ready, DO

## 2023-12-26 NOTE — Transfer of Care (Signed)
 Immediate Anesthesia Transfer of Care Note  Patient: Jacquelyne Balint  Procedure(s) Performed: XI ROBOTIC ASSISTED TOTAL LAPAROSCOPIC HYSTERECTOMY WITH BILATERAL SALPINGO OOPHORECTOMY (Bilateral: Abdomen) CYSTOSCOPY (Bladder)  Patient Location: PACU  Anesthesia Type:General  Level of Consciousness: awake  Airway & Oxygen Therapy: Patient Spontanous Breathing and Patient connected to face mask oxygen  Post-op Assessment: Report given to RN and Post -op Vital signs reviewed and stable  Post vital signs: Reviewed and stable  Last Vitals:  Vitals Value Taken Time  BP 124/78 12/26/23 1521  Temp 36.4 C 12/26/23 1521  Pulse 55 12/26/23 1528  Resp 14 12/26/23 1528  SpO2 100 % 12/26/23 1528  Vitals shown include unfiled device data.  Last Pain:  Vitals:   12/26/23 0852  TempSrc: Oral      Patients Stated Pain Goal: 6 (12/26/23 1324)  Complications: No notable events documented.

## 2023-12-26 NOTE — Anesthesia Procedure Notes (Signed)
 Procedure Name: Intubation Date/Time: 12/26/2023 11:47 AM  Performed by: Marcy Siren, RNPre-anesthesia Checklist: Patient identified, Emergency Drugs available, Suction available and Patient being monitored Patient Re-evaluated:Patient Re-evaluated prior to induction Oxygen Delivery Method: Circle System Utilized Preoxygenation: Pre-oxygenation with 100% oxygen Induction Type: IV induction Ventilation: Mask ventilation without difficulty Laryngoscope Size: Mac and 4 Grade View: Grade I Tube type: Oral Tube size: 7.0 mm Number of attempts: 1 Airway Equipment and Method: Stylet Placement Confirmation: ETT inserted through vocal cords under direct vision, positive ETCO2 and breath sounds checked- equal and bilateral Secured at: 22 cm Tube secured with: Tape Dental Injury: Teeth and Oropharynx as per pre-operative assessment

## 2023-12-26 NOTE — Anesthesia Postprocedure Evaluation (Signed)
 Anesthesia Post Note  Patient: Sydney Chapman  Procedure(s) Performed: XI ROBOTIC ASSISTED TOTAL LAPAROSCOPIC HYSTERECTOMY WITH BILATERAL SALPINGO OOPHORECTOMY (Bilateral: Abdomen) CYSTOSCOPY (Bladder)     Patient location during evaluation: PACU Anesthesia Type: General Level of consciousness: awake and alert Pain management: pain level controlled Vital Signs Assessment: post-procedure vital signs reviewed and stable Respiratory status: spontaneous breathing, nonlabored ventilation and respiratory function stable Cardiovascular status: blood pressure returned to baseline and stable Postop Assessment: no apparent nausea or vomiting Anesthetic complications: no   No notable events documented.  Last Vitals:    Last Pain:                 Collene Schlichter

## 2023-12-26 NOTE — Discharge Summary (Signed)
 Physician Discharge Summary  Patient ID: Sydney Chapman MRN: [id number] DOB/AGE: 05/20/1966 58 y.o.  Admit date: 12/26/2023 Discharge date: 12/27/2023  Admission Diagnoses: Abnormal uterine bleeding Endometrial hyperplasia without atypia  Discharge Diagnoses:  Principal Problem:   Endometrial hyperplasia without atypia Abnormal uterine bleeding  Procedure(s): 1. Robotic Assisted Total Laparoscopic Hysterectomy and Bilateral Salpingo-oophorectomy 2. Cystoscopy  Discharged Condition: good  Hospital Course: Patient was admitted on 12/26/2023 for the above named procedure(s) for the above named diagnoses. Prior to hospital discharge, patient was tolerating PO, ambulating, voiding spontaneously, and pain was well-controlled. See hospital chart for specific details. Patient was discharged home in stable condition.  Consults:  None  Significant Diagnostic Studies: None  Treatments: surgery: As documented above  Discharge Exam: Blood pressure 122/66, pulse 68, temperature 98.3 F (36.8 C), temperature source Oral, resp. rate 16, height 5\' 7"  (1.702 m), weight 126.1 kg, last menstrual period 06/13/2012, SpO2 95%. Gen:  NAD, pleasant and cooperative Cardio:  RRR Pulm:  CTAB, no wheezes/rales/rhonchi Abd: Soft, non-distended, appropriately tender throughout, no rebound/guarding, laparoscopic port sites clean/dry/intact with skin glue atop Ext:  No bilateral LE edema, no bilateral calf tenderness  Disposition: Discharge disposition: 01-Home or Self Care       Discharge Instructions     Call MD for:   Complete by: As directed    Spotting to very light bleeding is normal as the vaginal cuff is healing. Call with any heavy vaginal bleeding (filling up more than one large pad in an hour).   Call MD for:  difficulty breathing, headache or visual disturbances   Complete by: As directed    Call MD for:  extreme fatigue   Complete by: As directed    Call MD for:  hives   Complete by:  As directed    Call MD for:  persistant dizziness or light-headedness   Complete by: As directed    Call MD for:  persistant nausea and vomiting   Complete by: As directed    Call MD for:  redness, tenderness, or signs of infection (pain, swelling, redness, odor or green/yellow discharge around incision site)   Complete by: As directed    Call MD for:  severe uncontrolled pain   Complete by: As directed    Call MD for:  temperature >100.4   Complete by: As directed    Diet - low sodium heart healthy   Complete by: As directed    Driving Restrictions   Complete by: As directed    No driving for at least 2 weeks.   Increase activity slowly   Complete by: As directed    Lifting restrictions   Complete by: As directed    No lifting greater than 10lbs for 6 weeks.   Other Restrictions   Complete by: As directed    No baths or submersion in water for at least 6 weeks.   Sexual Activity Restrictions   Complete by: As directed    No sexual intercourse or objects in the vagina for at least 6 weeks.      Allergies as of 12/27/2023   No Known Allergies      Medication List     STOP taking these medications    Ashwagandha Gummies 500 MG Chew Generic drug: Ashwagandha   BEET ROOT PO   COLLAGEN PO       TAKE these medications    ascorbic acid 500 MG tablet Commonly known as: VITAMIN C Take 500 mg by mouth daily.  chlorthalidone 25 MG tablet Commonly known as: HYGROTON Take 25 mg by mouth daily.   cholecalciferol 25 MCG (1000 UNIT) tablet Commonly known as: VITAMIN D3 Take 1,000 Units by mouth daily.   Citracal +D3 Tabs Take 1 tablet by mouth 2 (two) times daily with a meal.   ibuprofen 800 MG tablet Commonly known as: ADVIL Take 1 tablet (800 mg total) by mouth every 8 (eight) hours as needed.   levothyroxine 175 MCG tablet Commonly known as: SYNTHROID Take 175 mcg by mouth at bedtime.   losartan 25 MG tablet Commonly known as: COZAAR Take 25 mg by  mouth daily.   MENAQUINONE-7 PO Take 1,000 mg by mouth 2 (two) times a week. MK7 (menaquinone 7)  K2   oxyCODONE 5 MG immediate release tablet Commonly known as: Oxy IR/ROXICODONE Take 1 tablet (5 mg total) by mouth every 6 (six) hours as needed for severe pain (pain score 7-10) or breakthrough pain.   SUPER B COMPLEX PO Take 1 capsule by mouth 2 (two) times a week.   SYSTANE OP Place 1 drop into both eyes 2 (two) times daily as needed (dry eye).   Triple Omega-3-6-9 Caps Take 1 capsule by mouth 2 (two) times a week.         Follow-up Information     Steva Ready, DO Follow up in 2 week(s).   Specialty: Obstetrics and Gynecology Why: Please keep your 2 week and 6 week post-operative visits as previously scheduled. Contact information: [address] [place] [address] [phone]                 Signed: Steva Ready 12/27/2023, 7:56 AM

## 2023-12-26 NOTE — Op Note (Signed)
 Pre Op Dx:   1. Abnormal uterine bleeding 2. Endometrial hyperplasia without atypia  Post Op Dx:   Same as pre-operative diagnoses  Procedure:   1. Robotic Assisted Total Laparoscopic Hysterectomy and Bilateral Salpingo-oophorectomy 2. Cystoscopy  Surgeon:  Dr. Steva Ready Assistants:  Donne Hazel, RNFA Anesthesia:  General   EBL:  300cc  IVF:  900cc UOP:  200cc   Drains:  Foley catheter Specimen removed:  Uterus, cervix, bilateral fallopian tubes, and ovaries - sent to pathology Device(s) implanted: None Case Type:  Clean-contaminated Findings:  Normal-appearing but atrophic cervix. Normal-appearing uterus and bilateral ovaries. Evidence of prior bilateral tubal ligation but otherwise normal-appearing fallopian tubes. Dense adhesions between the bladder and lower uterine segment. Filmy adhesions between omentum and right abdominal wall anteriorly. Normal-appearing liver contours. Right ureter visualized with peristalsis pre and post hysterectomy. Unable to visualize left ureter. On cystoscopy, bladder appeared normal with bilateral ureteral jets were visualized. Complications: None Indications:  58 y.o. G3P3 with AUB and endometrial hyperplasia without atypia who desired definitive surgical management.  Description of each procedure:  After informed consent the patient was taken to the operating room and placed in dorsal supine position where general endotracheal anesthesia was administered and found to be adequate.  She was placed in dorsal lithotomy position with her arms tucked.  She was prepped and draped in the usual sterile fashion.  A timeout was called and the procedure confirmed.  A RUMI uterine manipulator with the Koh cup and a Foley catheter were placed.   A 8mm intraumbilical incision was made and a trochar was used to enter the abdomen under direct visualization.  Pneumoperitoneum was established and atraumatic entry confirmed. Two additional 8mm ports were placed  on either side of the umbilicus under direct visualization. All port sites were injected with 10cc local anesthetic. The pelvis was bathed in a 60cc Ropivicaine solution.  The patient was placed in Trendelenburg position and the [employer] robotic device was docked.  Next, attention was turned to the console where the hysterectomy was performed.  The right infundibulopelvic ligament divided, the broad ligament was then sequentially divided working in the direction of the round ligament, and the right round ligament was divided. This process was repeated on the contralateral side. The anterior leaflet of the broad ligament was divided to create a bladder flap. The bladder was backfilled with 150cc of Meytheylene blue. An additional 8mm lower quadrant port was placed under direct visualization to aid with retraction due to difficulty with mobilizing the bladder from the uterus. The adhesions between the bladder and uterus were taken down with combination of sharp dissection, electrosurgery, and hydrodissection. The bladder was mobilized and then drained. The uterine artery and vein were skeletonized and desiccated superior to the Koh cup.  This process was repeated on the contralateral side. Uterine blanching was observed.  A circumferential colpotomy was created along the ridge of the Koh cup and the uterus was passed off the field. The vaginal occluder was placed in the vagina to maintain pneumoperitoneum.  The vaginal cuff was noted to have significant oozing  and small bleeding vessels made hemostatic with electrosurgery. The vaginal cuff was then closed with V-loc suture. Hemostasis confirmed. Surgicel hemostatic powder x 3 was placed on the vaginal cuff and adnexa bilaterally. TXA 1g IV was given due to ooziness. Hemostasis noted prior to reducing peritoneum. The [employer] robotic device was undocked and all ports were visualized.   The pneumoperitoneum was reduced completely with the assistance of  two deep  breaths and all ports were removed. The skin was closed with 4-0 Vicryl in subcuticular fashion with skin glue placed atop each port site.   Cystoscopy was performed and demonstrated intact urothelium throughout the bladder, a normal-appearing trigone and bilaterally patent ureteral orifices with normal urine jets noted. The vagina was inspected and there were no vaginal tears noted and no foreign objects remaining in the vagina. The patient was returned to dorsal supine position, awakened and extubated in the OR having appeared to tolerate the procedure well.  All sponge, needle, and instrument counts were correct x 2 at the end of the case.  Disposition:  PACU  Steva Ready, DO

## 2023-12-27 ENCOUNTER — Encounter (HOSPITAL_COMMUNITY): Payer: Self-pay | Admitting: Obstetrics and Gynecology

## 2023-12-27 DIAGNOSIS — N939 Abnormal uterine and vaginal bleeding, unspecified: Secondary | ICD-10-CM | POA: Diagnosis not present

## 2023-12-29 LAB — SURGICAL PATHOLOGY
